# Patient Record
Sex: Female | Born: 1960 | Race: White | Hispanic: No | Marital: Married | Smoking: Never smoker
Health system: Southern US, Community
[De-identification: ages and names within clinical notes are randomized; demographics above are authoritative.]

## PROBLEM LIST (undated history)

## (undated) DIAGNOSIS — K2970 Gastritis, unspecified, without bleeding: Secondary | ICD-10-CM

## (undated) DIAGNOSIS — A048 Other specified bacterial intestinal infections: Secondary | ICD-10-CM

## (undated) HISTORY — PX: KNEE SURGERY: SHX244

---

## 1997-07-03 HISTORY — PX: TUBAL LIGATION: SHX77

## 1997-09-05 ENCOUNTER — Inpatient Hospital Stay (HOSPITAL_COMMUNITY): Admission: AD | Admit: 1997-09-05 | Discharge: 1997-09-05 | Payer: Self-pay | Admitting: *Deleted

## 1997-09-08 ENCOUNTER — Inpatient Hospital Stay (HOSPITAL_COMMUNITY): Admission: AD | Admit: 1997-09-08 | Discharge: 1997-09-08 | Payer: Self-pay | Admitting: Obstetrics and Gynecology

## 2002-07-03 HISTORY — PX: OTHER SURGICAL HISTORY: SHX169

## 2005-01-20 ENCOUNTER — Ambulatory Visit: Payer: Self-pay | Admitting: Family Medicine

## 2005-06-15 ENCOUNTER — Ambulatory Visit: Payer: Self-pay | Admitting: Family Medicine

## 2005-09-26 ENCOUNTER — Ambulatory Visit: Payer: Self-pay | Admitting: Specialist

## 2005-12-06 ENCOUNTER — Ambulatory Visit: Payer: Self-pay | Admitting: Family Medicine

## 2006-12-24 ENCOUNTER — Ambulatory Visit: Payer: Self-pay | Admitting: Family Medicine

## 2007-04-02 ENCOUNTER — Telehealth (INDEPENDENT_AMBULATORY_CARE_PROVIDER_SITE_OTHER): Payer: Self-pay | Admitting: *Deleted

## 2007-04-16 ENCOUNTER — Ambulatory Visit: Payer: Self-pay | Admitting: Family Medicine

## 2007-04-16 DIAGNOSIS — N949 Unspecified condition associated with female genital organs and menstrual cycle: Secondary | ICD-10-CM

## 2007-04-16 DIAGNOSIS — N951 Menopausal and female climacteric states: Secondary | ICD-10-CM

## 2007-04-16 LAB — CONVERTED CEMR LAB
Beta hcg, urine, semiquantitative: NEGATIVE
FSH: 5.9 milliintl units/mL

## 2007-07-19 ENCOUNTER — Telehealth (INDEPENDENT_AMBULATORY_CARE_PROVIDER_SITE_OTHER): Payer: Self-pay | Admitting: *Deleted

## 2007-07-22 ENCOUNTER — Ambulatory Visit: Payer: Self-pay | Admitting: Internal Medicine

## 2007-07-22 DIAGNOSIS — N390 Urinary tract infection, site not specified: Secondary | ICD-10-CM

## 2007-07-22 LAB — CONVERTED CEMR LAB
Bilirubin Urine: NEGATIVE
Nitrite: NEGATIVE
Protein, U semiquant: NEGATIVE
Specific Gravity, Urine: 1.015

## 2007-07-23 ENCOUNTER — Encounter: Payer: Self-pay | Admitting: Internal Medicine

## 2007-07-24 ENCOUNTER — Telehealth: Payer: Self-pay | Admitting: Family Medicine

## 2007-07-24 DIAGNOSIS — J309 Allergic rhinitis, unspecified: Secondary | ICD-10-CM | POA: Insufficient documentation

## 2007-07-24 DIAGNOSIS — K219 Gastro-esophageal reflux disease without esophagitis: Secondary | ICD-10-CM

## 2007-08-23 ENCOUNTER — Ambulatory Visit: Payer: Self-pay | Admitting: Family Medicine

## 2007-08-29 ENCOUNTER — Telehealth: Payer: Self-pay | Admitting: Family Medicine

## 2007-09-09 ENCOUNTER — Telehealth: Payer: Self-pay | Admitting: Family Medicine

## 2008-01-10 ENCOUNTER — Ambulatory Visit: Payer: Self-pay | Admitting: Family Medicine

## 2008-01-10 DIAGNOSIS — R51 Headache: Secondary | ICD-10-CM

## 2008-01-10 DIAGNOSIS — R519 Headache, unspecified: Secondary | ICD-10-CM | POA: Insufficient documentation

## 2008-03-13 ENCOUNTER — Ambulatory Visit: Payer: Self-pay | Admitting: Family Medicine

## 2008-04-20 ENCOUNTER — Telehealth (INDEPENDENT_AMBULATORY_CARE_PROVIDER_SITE_OTHER): Payer: Self-pay | Admitting: *Deleted

## 2008-07-07 ENCOUNTER — Ambulatory Visit: Payer: Self-pay | Admitting: Family Medicine

## 2008-07-07 DIAGNOSIS — L723 Sebaceous cyst: Secondary | ICD-10-CM

## 2008-07-07 DIAGNOSIS — J069 Acute upper respiratory infection, unspecified: Secondary | ICD-10-CM | POA: Insufficient documentation

## 2008-07-15 ENCOUNTER — Ambulatory Visit: Payer: Self-pay | Admitting: Family Medicine

## 2008-12-03 ENCOUNTER — Telehealth: Payer: Self-pay | Admitting: Family Medicine

## 2009-03-09 ENCOUNTER — Telehealth (INDEPENDENT_AMBULATORY_CARE_PROVIDER_SITE_OTHER): Payer: Self-pay | Admitting: *Deleted

## 2009-03-10 ENCOUNTER — Ambulatory Visit: Payer: Self-pay | Admitting: Family Medicine

## 2009-03-10 DIAGNOSIS — M25519 Pain in unspecified shoulder: Secondary | ICD-10-CM

## 2009-03-10 DIAGNOSIS — M109 Gout, unspecified: Secondary | ICD-10-CM

## 2009-03-10 DIAGNOSIS — M545 Low back pain, unspecified: Secondary | ICD-10-CM | POA: Insufficient documentation

## 2009-03-10 DIAGNOSIS — M25559 Pain in unspecified hip: Secondary | ICD-10-CM

## 2009-04-19 ENCOUNTER — Telehealth: Payer: Self-pay | Admitting: Family Medicine

## 2009-07-29 ENCOUNTER — Ambulatory Visit: Payer: Self-pay | Admitting: Family Medicine

## 2009-07-29 ENCOUNTER — Telehealth: Payer: Self-pay | Admitting: Family Medicine

## 2009-07-29 DIAGNOSIS — M25569 Pain in unspecified knee: Secondary | ICD-10-CM

## 2009-07-29 DIAGNOSIS — M1710 Unilateral primary osteoarthritis, unspecified knee: Secondary | ICD-10-CM

## 2009-07-29 DIAGNOSIS — IMO0002 Reserved for concepts with insufficient information to code with codable children: Secondary | ICD-10-CM | POA: Insufficient documentation

## 2009-08-20 ENCOUNTER — Ambulatory Visit: Payer: Self-pay | Admitting: Licensed Clinical Social Worker

## 2009-08-27 ENCOUNTER — Ambulatory Visit: Payer: Self-pay | Admitting: Licensed Clinical Social Worker

## 2009-09-06 ENCOUNTER — Telehealth (INDEPENDENT_AMBULATORY_CARE_PROVIDER_SITE_OTHER): Payer: Self-pay | Admitting: *Deleted

## 2009-09-06 ENCOUNTER — Encounter: Payer: Self-pay | Admitting: Family Medicine

## 2009-09-10 ENCOUNTER — Ambulatory Visit: Payer: Self-pay | Admitting: Licensed Clinical Social Worker

## 2009-10-25 ENCOUNTER — Telehealth (INDEPENDENT_AMBULATORY_CARE_PROVIDER_SITE_OTHER): Payer: Self-pay | Admitting: *Deleted

## 2010-08-02 NOTE — Progress Notes (Signed)
Summary: Pt Questions re: Discharge  Phone Note Call from Patient   Caller: Patient Action Taken: Phone Call Completed Details for Reason: Questions re: discharge from practice Summary of Call: Pt stated that she received the discharge letter.  Pt called and had questions about discharge from the practice. The documentation from the discharge letter and verbage from the letter was discussed with her.  It was explained that she could not establish with another physician and that discharge letters from [facility] physicians were intended to be discharge notification from the practice; therefore, she was unable to be reassigned to another [facility] physician.  The telephone number and information for seeking another physician in the letter was discussed.  It was explained that no information for another patient could be discussed due to privacy/HIPPA.  She verbalized that she understood the letter and discharge recommendations made in the discharge letter.   Initial call taken by: Clarisa Schools,  October 25, 2009 9:10 AM

## 2010-08-02 NOTE — Letter (Signed)
Summary: Discharge Letter  [place] [facility]  [address] [place], [place] [address]   Phone: [phone]  Fax: [phone]       09/06/2009 MRN: [id number]  [facility] [address] [place], [place]  [address]  Dear Ms. Picinich,   I find it necessary to inform you that the office will no longer be able to provide medical care to you due to the nature of a recent call in which your behavior was reported as demanding and it was indicated that there was a communication of threat during a telephone coversation.    Since your condition requires medical attention, it is suggested that you place your self under the care of another physician without delay. If you desire, the doctor will be available for emergency care for 30 days after you receive this letter.  This should give you ample time to select a physician of your choice from the many competent providers in this area. You may want to call the local medical society or [facility]'s physician referral service [phone]) for their assistance in locating a new physician. With your written authorization, I will make a copy of your medical record available to your new physician.   Sincerely,  [facility]  Appended Document: Discharge Letter  IDX and EMR have been updated to reflect dismissal. Letter sent by certified mail 10/15/09.  Appended Document: Discharge Letter Received signed receipt from [employer]PS verifying delivery on 10/20/09.

## 2010-08-02 NOTE — Assessment & Plan Note (Signed)
Summary: 30 MIN HIP AND KNEE/ PAIN/RBH   Vital Signs:  Patient profile:   50 year old female Weight:      128.38 pounds BMI:     23.57 Temp:     98.1 degrees F oral Pulse rate:   72 / minute Pulse rhythm:   regular BP sitting:   100 / 60  (left arm) Cuff size:   regular  Vitals Entered By: Linde Gillis CMA Duncan Dull) (July 29, 2009 9:58 AM) CC: left hip and knee pain   History of Present Illness: 50 year old female  For about six month, has not been able to do hardly anthing for about six months, now with some posterior fullness and medial knee pain.  Distantly, Had a bucket handle meniscal tear. Has had 3 meniscus injuries.  Had some loose cartilage. 2 others done at [facility].  Arthroscopy x 3, all medial  Last one done at [facility].   Works out at 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 Swollen initially  No mechanical locking up of the joint. No symptomatic giving way. Intolerant to most NSAIDS, can take Advil  Allergies: 1)  ! Keflex 2)  ! * Marcaine 3)  * Nsaids Group 4)  * Sulfa (Sulfonamides) Group 5)  * Vantin (Cefpodoxime)  Past History:  Past medical, surgical, family and social histories (including risk factors) reviewed, and no changes noted (except as noted below).  Past Medical History: Reviewed history from 07/22/2007 and no changes required. Allergic rhinitis GERD Frequent UTI's  Past Surgical History: Reviewed history from 07/22/2007 and no changes required. Right knee surgery- meniscus repair Ureter surgery Cardio work-up-  2000 (per patient)  Family History: Reviewed history from 07/22/2007 and no changes required. Father deceased of cancer, leukemia and brain tumor Mother is alive and healthy Paternal grandfather- colon cancer Two paternal cousins with colon cancer One paternal cousin with breast cancer  Social History: Reviewed history from 07/07/2008 and  no changes required. 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 Otherwise the pertinent positives of the ROS are noted above.    Physical Exam  General:  GEN: Well-developed,well-nourished,in no acute distress; alert,appropriate and cooperative throughout examination HEENT: Normocephalic and atraumatic without obvious abnormalities. No apparent alopecia or balding. Ears, externally no deformities PULM: Breathing comfortably in no respiratory distress EXT: No clubbing, cyanosis, or edema PSYCH: Normally interactive. Cooperative during the interview. Pleasant. Friendly and conversant. Not anxious or depressed appearing. Normal, full affect.  Msk:  Gait: Normal heel toe pattern ROM: loss of flexion, to 125 Effusion: minimal, posterior fullness present Echymosis or edema: none Patellar tendon NT Painful PLICA: neg Patellar grind: positive Medial and lateral patellar facet loading: pos medial and lateral joint lines:TTP medially Mcmurray's pos for pain Flexion-pinch pos Varus and valgus stress: stable Lachman: neg Ant and Post drawer: neg Hip abduction, IR, ER: WNL Hip flexion str: 5/5 Hip abd: 5/5 Quad: 5/5 VMO atrophy:yes Hamstring concentric and eccentric: 5/5, NT, no defect   Impression & Recommendations:  Problem # 1:  KNEE PAIN, RIGHT (ICD-719.46) Assessment New  X-rays: AP Bilateral Weight-bearing, Weightbearing Lateral, Sunrise views Indication: knee  pain Findings:  R medial compartment with significant loss of joint space and flattening for medial femoral condyle. Moderate PF OA. Overall, moderate OA of R knee with approaching severe medial compartment OA. No fx.  Severe OA  flare vs. degenerative meniscal tear  Trial of conservative management with knee injection, ice, routine NSAIDS, if no prompt recovery, then MRI R knee to evaluate for internal derangement. I suspect a high likelihood of internal derangement, meniscal tear, positive mcmurrays and flexion pinch tests.  Knee Injection Patient verbally consented to procedure. Risks, benefits, and alternatives explained. Sterilely prepped with betadine. Ethyl cholride used for anesthesia. 9 cc 0.5% Marcaine mixed with 1 cc of Kenalog 40 mg injected using the anterolateral approach without difficulty. No complications with procedure and tolerated well. Patient had decreased pain post-injection.   Her updated medication list for this problem includes:    Naprosyn 500 Mg Tabs (Naproxen) .Marland Kitchen... 1 by mouth two times a day with food as needed pain  Orders: Radiology other (Radiology Other) Joint Aspirate / Injection, Large (20610) Kenalog 10mg  (4units) (J3301)  Problem # 2:  DEGENERATIVE JOINT DISEASE, RIGHT KNEE (ICD-715.96) Assessment: New  Her updated medication list for this problem includes:    Naprosyn 500 Mg Tabs (Naproxen) .Marland Kitchen... 1 by mouth two times a day with food as needed pain  Orders: Joint Aspirate / Injection, Large (20610) Kenalog 10mg  (4units) (J3301)  Complete Medication List: 1)  Naprosyn 500 Mg Tabs (Naproxen) .Marland Kitchen.. 1 by mouth two times a day with food as needed pain 2)  Amoxicillin 500 Mg Caps (Amoxicillin) .Marland Kitchen.. 1 by mouth three times a day for 7 days  Patient Instructions: 1)  Tylenol: 2 tablets up to 3-4 times a day 2)  Topical Capzaicin Cream, as needed (wear glove to put on) 3)  For flares, corticosteroid injections help. 4)  Hyaluronic Acid injections have good success, average relief is 6 months 5)  Glucosamine and Chondroitin often helpful 6)  Omega-3 fish oils may help 7)  Ice joints on bad days, 20 min, 2-3 x / day 8)  REGULAR EXERCISE: strengthening and aerobic  exercise  Current Allergies (reviewed today): ! KEFLEX ! * MARCAINE * NSAIDS GROUP * SULFA (SULFONAMIDES) GROUP * VANTIN (CEFPODOXIME)

## 2010-08-02 NOTE — Progress Notes (Signed)
Summary: Feeling weird after steroid injection  Phone Note Call from Patient Call back at [phone]   Caller: Patient Call For: Kerby Nora MD Summary of Call: Patient was seen this morning by Dr. Patsy Lager and received a steroid shot in her right knee.  Now experiencing some dizziness, shakiness, sweating, skin feels clammy to the touch, patient says she just feels tingly all over.  Never had a steroid injection before.  Able to eat and drink without being sick to her stomach.  Wants to know if this is normal?  Please advise. Initial call taken by: Linde Gillis CMA Duncan Dull),  July 29, 2009 3:17 PM  Follow-up for Phone Call        Spoke with patient she  says that every since she got the shot she has been dizzy,nausea,clammy,she says she went home ate and took 4 advil patient says that she is not used to taken medication a lot. Says she just feels tingley from head to toe. She said that she just wanted to let you know how she was doing. She will await your call this evening. Follow-up by: Benny Lennert CMA Duncan Dull),  July 29, 2009 3:40 PM  Additional Follow-up for Phone Call Additional follow up Details #1::        d/w patient  onset after inj.  sounds like reaction to marcaine would be highly unlikely to be from kenalog  feels ok now Additional Follow-up by: Hannah Beat MD,  July 29, 2009 5:41 PM

## 2010-08-02 NOTE — Progress Notes (Signed)
Summary: Discharge Recommendation Made to Manager  Phone Note Other Incoming   Details for Reason: Discharge Info Summary of Call: Management ONLY to discuss with patient.Marland KitchenMarland KitchenDo not give staff or manager's names or information to patient.  Do not schedule patient and do not forward calls to patient's nurse or Dr. Milinda Antis.  The patient is a [employer] features editor - Do not discuss or state [employer] [place] info to patient.  All communication must go through Managers.       See discharge letter with details on discharge and on the health system/[facility] policies and protocol for addressing patient behavior.  [facility] is committed to providing quality care and expects patients to be compliant and respectful to physicians and staff and upon review of information provided, the patient has been recommended for discharge.   Initial call taken by: Clarisa Schools,  September 06, 2009 4:44 PM     Appended Document: Discharge Recommendation Made to Manager (CONFIDENTIAL DOCUMENTATION)  Phone Note/Statement reported - pt stated "if she had her hands on you (physician), she would choke you".  Also reported, patient has hx of escalated behavior and steroid use that could escalate behavior.

## 2010-11-15 NOTE — Assessment & Plan Note (Signed)
[facility]                                 ON-CALL NOTE   NAME:CUSTERWinston, Sobczyk                        MRN:          [id number]  DATE:08/17/2007                            DOB:          04-18-1961    Patient of Dr. Milinda Antis.   Patient calling because she has 2-day history of runny nose, head  congestion, and cough.  She has no fever, chills, nausea, vomiting,  diarrhea, nor sputum production.  She is a nonsmoker.  She said she has  not been sick in 15 years.  She is otherwise well.  Advised this is most  likely a viral syndrome.  Treat symptomatically.  Call if any fever,  chills, sputum production, etc.     Tinnie Gens A. Tawanna Cooler, MD  Electronically Signed    JAT/MedQ  DD: 08/17/2007  DT: 08/19/2007  Job #: [id number]

## 2013-12-23 ENCOUNTER — Emergency Department: Payer: Self-pay | Admitting: Emergency Medicine

## 2013-12-23 LAB — COMPREHENSIVE METABOLIC PANEL
AST: 42 U/L — AB (ref 15–37)
Albumin: 4.1 g/dL (ref 3.4–5.0)
Alkaline Phosphatase: 66 U/L
Anion Gap: 8 (ref 7–16)
BUN: 24 mg/dL — ABNORMAL HIGH (ref 7–18)
Bilirubin,Total: 0.4 mg/dL (ref 0.2–1.0)
CREATININE: 0.76 mg/dL (ref 0.60–1.30)
Calcium, Total: 9.4 mg/dL (ref 8.5–10.1)
Chloride: 109 mmol/L — ABNORMAL HIGH (ref 98–107)
Co2: 26 mmol/L (ref 21–32)
EGFR (African American): >60
EGFR (Non-African Amer.): >60
GLUCOSE: 96 mg/dL (ref 65–99)
Osmolality: 289 (ref 275–301)
POTASSIUM: 3.8 mmol/L (ref 3.5–5.1)
SGPT (ALT): 36 U/L (ref 12–78)
Sodium: 143 mmol/L (ref 136–145)
Total Protein: 7.4 g/dL (ref 6.4–8.2)

## 2013-12-23 LAB — CBC WITH DIFFERENTIAL/PLATELET
BASOS ABS: 0.1 10*3/uL (ref 0.0–0.1)
BASOS PCT: 0.6 %
Eosinophil #: 0 10*3/uL (ref 0.0–0.7)
Eosinophil %: 0.3 %
HCT: 41.4 % (ref 35.0–47.0)
HGB: 13.2 g/dL (ref 12.0–16.0)
LYMPHS ABS: 2.6 10*3/uL (ref 1.0–3.6)
Lymphocyte %: 22.8 %
MCH: 28.8 pg (ref 26.0–34.0)
MCHC: 31.9 g/dL — ABNORMAL LOW (ref 32.0–36.0)
MCV: 90 fL (ref 80–100)
MONOS PCT: 7.2 %
Monocyte #: 0.8 x10 3/mm (ref 0.2–0.9)
NEUTROS PCT: 69.1 %
Neutrophil #: 8 10*3/uL — ABNORMAL HIGH (ref 1.4–6.5)
PLATELETS: 242 10*3/uL (ref 150–440)
RBC: 4.59 10*6/uL (ref 3.80–5.20)
RDW: 13.4 % (ref 11.5–14.5)
WBC: 11.5 10*3/uL — ABNORMAL HIGH (ref 3.6–11.0)

## 2013-12-23 LAB — APTT: Activated PTT: 26.6 secs (ref 23.6–35.9)

## 2013-12-23 LAB — CBC
HCT: 40.2 % (ref 35.0–47.0)
HGB: 13.5 g/dL (ref 12.0–16.0)
MCH: 30.1 pg (ref 26.0–34.0)
MCHC: 33.6 g/dL (ref 32.0–36.0)
MCV: 89 fL (ref 80–100)
PLATELETS: 250 10*3/uL (ref 150–440)
RBC: 4.5 10*6/uL (ref 3.80–5.20)
RDW: 13.5 % (ref 11.5–14.5)
WBC: 9.6 10*3/uL (ref 3.6–11.0)

## 2013-12-23 LAB — PROTIME-INR
INR: 1
INR: 1
PROTHROMBIN TIME: 13.1 s (ref 11.5–14.7)
Prothrombin Time: 12.7 s

## 2013-12-23 LAB — FIBRINOGEN
FIBRINOGEN: 281 mg/dL (ref 210–470)
Fibrinogen: 253 mg/dL (ref 210–470)

## 2013-12-25 ENCOUNTER — Emergency Department: Payer: Self-pay | Admitting: Emergency Medicine

## 2014-08-05 DIAGNOSIS — M1711 Unilateral primary osteoarthritis, right knee: Secondary | ICD-10-CM | POA: Insufficient documentation

## 2016-07-02 ENCOUNTER — Encounter: Payer: Self-pay | Admitting: Emergency Medicine

## 2016-07-02 ENCOUNTER — Emergency Department
Admission: EM | Admit: 2016-07-02 | Discharge: 2016-07-02 | Disposition: A | Discharge status: Home / Self Care | Payer: Managed Care, Other (non HMO) | Attending: Emergency Medicine | Admitting: Emergency Medicine

## 2016-07-02 ENCOUNTER — Emergency Department: Payer: Managed Care, Other (non HMO)

## 2016-07-02 DIAGNOSIS — R1030 Lower abdominal pain, unspecified: Secondary | ICD-10-CM | POA: Diagnosis present

## 2016-07-02 DIAGNOSIS — A09 Infectious gastroenteritis and colitis, unspecified: Secondary | ICD-10-CM | POA: Insufficient documentation

## 2016-07-02 DIAGNOSIS — Z79899 Other long term (current) drug therapy: Secondary | ICD-10-CM | POA: Diagnosis not present

## 2016-07-02 HISTORY — DX: Other specified bacterial intestinal infections: A04.8

## 2016-07-02 LAB — COMPREHENSIVE METABOLIC PANEL
ALBUMIN: 4.2 g/dL (ref 3.5–5.0)
ALT: 49 U/L (ref 14–54)
ANION GAP: 7 (ref 5–15)
AST: 41 U/L (ref 15–41)
Alkaline Phosphatase: 64 U/L (ref 38–126)
BUN: 14 mg/dL (ref 6–20)
CALCIUM: 9.2 mg/dL (ref 8.9–10.3)
CHLORIDE: 106 mmol/L (ref 101–111)
CO2: 26 mmol/L (ref 22–32)
CREATININE: 0.9 mg/dL (ref 0.44–1.00)
GFR calc Af Amer: >60 mL/min (ref >60)
GFR calc non Af Amer: >60 mL/min (ref >60)
GLUCOSE: 112 mg/dL — AB (ref 65–99)
Potassium: 3.9 mmol/L (ref 3.5–5.1)
SODIUM: 139 mmol/L (ref 135–145)
Total Bilirubin: 0.5 mg/dL (ref 0.3–1.2)
Total Protein: 7.2 g/dL (ref 6.5–8.1)

## 2016-07-02 LAB — CBC
HCT: 44.8 % (ref 35.0–47.0)
HEMOGLOBIN: 15.2 g/dL (ref 12.0–16.0)
MCH: 29 pg (ref 26.0–34.0)
MCHC: 33.8 g/dL (ref 32.0–36.0)
MCV: 85.7 fL (ref 80.0–100.0)
Platelets: 321 10*3/uL (ref 150–440)
RBC: 5.23 MIL/uL — ABNORMAL HIGH (ref 3.80–5.20)
RDW: 13.4 % (ref 11.5–14.5)
WBC: 13.7 10*3/uL — ABNORMAL HIGH (ref 3.6–11.0)

## 2016-07-02 LAB — URINALYSIS, COMPLETE (UACMP) WITH MICROSCOPIC
BILIRUBIN URINE: NEGATIVE
Bacteria, UA: NONE SEEN
Glucose, UA: NEGATIVE mg/dL
Hgb urine dipstick: NEGATIVE
KETONES UR: 20 mg/dL — AB
Nitrite: NEGATIVE
PH: 5 (ref 5.0–8.0)
PROTEIN: 30 mg/dL — AB
RBC / HPF: NONE SEEN RBC/hpf (ref 0–5)
Specific Gravity, Urine: 1.027 (ref 1.005–1.030)

## 2016-07-02 LAB — LIPASE, BLOOD: LIPASE: 18 U/L (ref 11–51)

## 2016-07-02 MED ORDER — MORPHINE SULFATE (PF) 4 MG/ML IV SOLN
4.0000 mg | Freq: Once | INTRAVENOUS | Status: AC
  Administered 2016-07-02: 4 mg via INTRAVENOUS
  Filled 2016-07-02: qty 1

## 2016-07-02 MED ORDER — ONDANSETRON 4 MG PO TBDP
4.0000 mg | ORAL_TABLET | Freq: Once | ORAL | Status: AC
  Administered 2016-07-02: 4 mg via ORAL
  Filled 2016-07-02: qty 1

## 2016-07-02 MED ORDER — CIPROFLOXACIN HCL 500 MG PO TABS: 500.0000 mg | ORAL_TABLET | Freq: Two times a day (BID) | ORAL | 0 refills | Status: DC

## 2016-07-02 MED ORDER — SODIUM CHLORIDE 0.9 % IV BOLUS (SEPSIS)
1000.0000 mL | Freq: Once | INTRAVENOUS | Status: AC
  Administered 2016-07-02: 1000 mL via INTRAVENOUS

## 2016-07-02 MED ORDER — IOPAMIDOL (ISOVUE-300) INJECTION 61%
15.0000 mL | INTRAVENOUS | Status: AC
  Administered 2016-07-02 (×2): 15 mL via ORAL

## 2016-07-02 MED ORDER — HYDROCODONE-ACETAMINOPHEN 5-325 MG PO TABS: 1.0000 | ORAL_TABLET | Freq: Four times a day (QID) | ORAL | 0 refills | Status: DC | PRN

## 2016-07-02 MED ORDER — METRONIDAZOLE 500 MG PO TABS
500.0000 mg | ORAL_TABLET | Freq: Once | ORAL | Status: AC
  Administered 2016-07-02: 500 mg via ORAL
  Filled 2016-07-02: qty 1

## 2016-07-02 MED ORDER — ONDANSETRON 4 MG PO TBDP: 4.0000 mg | ORAL_TABLET | Freq: Four times a day (QID) | ORAL | 0 refills | Status: DC | PRN

## 2016-07-02 MED ORDER — HYDROCODONE-ACETAMINOPHEN 5-325 MG PO TABS
ORAL_TABLET | ORAL | Status: AC
  Administered 2016-07-02: 1 via ORAL
  Filled 2016-07-02: qty 1

## 2016-07-02 MED ORDER — CIPROFLOXACIN HCL 500 MG PO TABS
500.0000 mg | ORAL_TABLET | Freq: Once | ORAL | Status: AC
  Administered 2016-07-02: 500 mg via ORAL
  Filled 2016-07-02: qty 1

## 2016-07-02 MED ORDER — ONDANSETRON HCL 4 MG/2ML IJ SOLN
4.0000 mg | Freq: Once | INTRAMUSCULAR | Status: AC
  Administered 2016-07-02: 4 mg via INTRAVENOUS
  Filled 2016-07-02: qty 2

## 2016-07-02 MED ORDER — HYDROCODONE-ACETAMINOPHEN 5-325 MG PO TABS
1.0000 | ORAL_TABLET | Freq: Once | ORAL | Status: AC
  Administered 2016-07-02: 1 via ORAL

## 2016-07-02 MED ORDER — METRONIDAZOLE 500 MG PO TABS: 500.0000 mg | ORAL_TABLET | Freq: Three times a day (TID) | ORAL | 0 refills | Status: DC

## 2016-07-02 NOTE — ED Provider Notes (Signed)
[facility] Emergency Department Provider Note   ____________________________________________   First MD Initiated Contact with Patient 07/02/16 1919     (approximate)  I have reviewed the triage vital signs and the nursing notes.   HISTORY  Chief Complaint Abdominal Pain    HPI Taylor Navarro is a 55 y.o. female here for evaluation of abdominal pain and diarrhea and nausea and vomiting.  The patient returned from [place], while she was there she felt like she had some loose stool and diarrhea and a couple of days which improved.  Yesterday she began expressing loose stools and having crampy lower abdominal pain, she vomited a couple times today after feeling nauseated. She's been able to continue drinking some water, but feels slightly fatigued.  No fever. No chest pain or shortness of breath.   Denies any bad food exposure, but does have trouble history none  Past Medical History:  Diagnosis Date  . Bacterial infection due to H. pylori     Patient Active Problem List   Diagnosis Date Noted  . DEGENERATIVE JOINT DISEASE, RIGHT KNEE 07/29/2009  . KNEE PAIN, RIGHT 07/29/2009  . GOUT 03/10/2009  . SHOULDER PAIN 03/10/2009  . HIP PAIN, RIGHT 03/10/2009  . BACK PAIN, LUMBAR 03/10/2009  . URI 07/07/2008  . SEBACEOUS CYST, NECK 07/07/2008  . HEADACHE 01/10/2008  . ALLERGIC RHINITIS 07/24/2007  . GERD 07/24/2007  . URINARY TRACT INFECTION, RECURRENT 07/22/2007  . 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 HOT FLASHES 04/16/2007    Past Surgical History:  Procedure Laterality Date  . KNEE SURGERY      Prior to Admission medications   Medication Sig Start Date End Date Taking? Authorizing Provider  Nutritional Supplements (JOINT FORMULA PO) Take 1 tablet by mouth. From [employer]   Yes Historical Provider, MD  ciprofloxacin (CIPRO) 500 MG tablet Take 1 tablet (500 mg total) by mouth 2 (two) times daily. 07/02/16   Sharyn Creamer, MD    HYDROcodone-acetaminophen (NORCO/VICODIN) 5-325 MG tablet Take 1 tablet by mouth every 6 (six) hours as needed for moderate pain. 07/02/16   Sharyn Creamer, MD  metroNIDAZOLE (FLAGYL) 500 MG tablet Take 1 tablet (500 mg total) by mouth 3 (three) times daily. 07/02/16   Sharyn Creamer, MD  ondansetron (ZOFRAN ODT) 4 MG disintegrating tablet Take 1 tablet (4 mg total) by mouth every 6 (six) hours as needed for nausea or vomiting. 07/02/16   Sharyn Creamer, MD    Allergies Bupivacaine; Cephalexin; and Sulfonamide derivatives  History reviewed. No pertinent family history.  Social History Social History  Substance Use Topics  . Smoking status: Never Smoker  . Smokeless tobacco: Never Used  . Alcohol use Yes    Review of Systems Constitutional: No fever/chills Eyes: No visual changes. ENT: No sore throat. Cardiovascular: Denies chest pain. Respiratory: Denies shortness of breath. Gastrointestinal: No black or bloody stool. No blood in her vomit  No constipation. Genitourinary: Negative for dysuria. Musculoskeletal: Negative for back pain. Skin: Negative for rash. Neurological: Negative for headaches, focal weakness or numbness.  10-point ROS otherwise negative.  ____________________________________________   PHYSICAL EXAM:  VITAL SIGNS: ED Triage Vitals  Enc Vitals Group     BP 07/02/16 1701 138/65     Pulse Rate 07/02/16 1701 88     Resp 07/02/16 1701 18     Temp 07/02/16 1701 97.5 F (36.4 C)     Temp Source 07/02/16 1701 Oral     SpO2 07/02/16 1701 99 %  Weight 07/02/16 1701 139 lb (63 kg)     Height 07/02/16 1701 5' (1.524 m)     Head Circumference --      Peak Flow --      Pain Score 07/02/16 1703 7     Pain Loc --      Pain Edu? --      Excl. in GC? --     Constitutional: Alert and oriented. Well appearing and in no acute distress. Eyes: Conjunctivae are normal. PERRL. EOMI. Head: Atraumatic. Nose: No congestion/rhinnorhea. Mouth/Throat: Mucous membranes are  moist.  Oropharynx non-erythematous. Neck: No stridor.   Cardiovascular: Normal rate, regular rhythm. Grossly normal heart sounds.  Good peripheral circulation. Respiratory: Normal respiratory effort.  No retractions. Lungs CTAB. Gastrointestinal: Soft and mild to moderately tender primarily in the lower abdomen in the right mid to right lower quadrant, without rebound or guarding. . No distention. No abdominal bruits. No CVA tenderness. Musculoskeletal: No lower extremity tenderness nor edema.  Neurologic:  Normal speech and language. No gross focal neurologic deficits are appreciated. No gait instability. Skin:  Skin is warm, dry and intact. No rash noted. Psychiatric: Mood and affect are normal. Speech and behavior are normal.  ____________________________________________   LABS (all labs ordered are listed, but only abnormal results are displayed)  Labs Reviewed  COMPREHENSIVE METABOLIC PANEL - Abnormal; Notable for the following:       Result Value   Glucose, Bld 112 (*)    All other components within normal limits  CBC - Abnormal; Notable for the following:    WBC 13.7 (*)    RBC 5.23 (*)    All other components within normal limits  URINALYSIS, COMPLETE (UACMP) WITH MICROSCOPIC - Abnormal; Notable for the following:    Color, Urine YELLOW (*)    APPearance HAZY (*)    Ketones, ur 20 (*)    Protein, ur 30 (*)    Leukocytes, UA SMALL (*)    Squamous Epithelial / LPF 6-30 (*)    All other components within normal limits  URINE CULTURE  LIPASE, BLOOD   ____________________________________________  EKG   ____________________________________________  RADIOLOGY  Ct Abdomen Pelvis Wo Contrast  Result Date: 07/02/2016 CLINICAL DATA:  Epigastric and periumbilical pain with diarrhea for 1 day. Recent travel. EXAM: CT ABDOMEN AND PELVIS WITHOUT CONTRAST TECHNIQUE: Multidetector CT imaging of the abdomen and pelvis was performed following the standard protocol without IV  contrast. COMPARISON:  None. FINDINGS: Lower chest: No acute abnormality. Hepatobiliary: No focal liver abnormality is seen. No gallstones, gallbladder wall thickening, or biliary dilatation. Pancreas: Unremarkable. No pancreatic ductal dilatation or surrounding inflammatory changes. Spleen: Normal in size without focal abnormality. Adrenals/Urinary Tract: Adrenal glands are unremarkable. Kidneys are normal, without renal calculi, focal lesion, or hydronephrosis. Bladder is unremarkable. Stomach/Bowel: Stomach and proximal small bowel appear normal. There is marked mural thickening and edema of the mid ileum. Mild adjacent mesenteric edema. Small to moderate volume ascites. Appendix and colon are normal. Vascular/Lymphatic: The abdominal aorta is normal in caliber with mild atherosclerotic calcification. No adenopathy in the abdomen or pelvis. Reproductive: Status post hysterectomy. No adnexal masses. Other: No extraluminal air.  No significant bowel obstruction. Musculoskeletal: No significant skeletal lesion. IMPRESSION: Marked mural thickening of the mid ileum with moderate edema but no significant obstruction. Terminal ileum appears relatively normal. Appendix is normal. No pneumatosis. No extraluminal air. This may represent a severe enteritis Electronically Signed   By: Ellery Plunkaniel R Mitchell M.D.   On: 07/02/2016 21:42  ____________________________________________   PROCEDURES  Procedure(s) performed: None  Procedures  Critical Care performed: No  ____________________________________________   INITIAL IMPRESSION / ASSESSMENT AND PLAN / ED COURSE  Pertinent labs & imaging results that were available during my care of the patient were reviewed by me and considered in my medical decision making (see chart for details).  Differential diagnosis includes but is not limited to, abdominal perforation, aortic dissection, cholecystitis, appendicitis, diverticulitis, colitis, esophagitis/gastritis,  kidney stone, pyelonephritis, urinary tract infection, aortic aneurysm. All are considered in decision and treatment plan. Based upon the patient's presentation and risk factors, patient appears to be suffering from a gastrointestinal illness, possibly travel related or infectious related. Proceed with CT scan given the focality and right lower abdomen, leukocytosis. I offered the patient and we discussed obtaining CT scan with IV contrast, and IV fluids with the patient did not wish for IV fluids or contrast at this time. Patient does not wish take risk of consciousness duration at this time, and wants to drink water and not have an IV as she reports whenever she has received IV fluid in the past is caused discomfort and bruising about her veins. I discussed with her and recommend hydration and IV contrast to improve the images from her CAT scan, also make sure it is not a cause such as a blockage of a vessel, or a cause from a artery potentially causing her pain, but after answering questions and we will pursue a CT with oral contrast only after discussion of risks and benefits was performed.   Clinical Course    ----------------------------------------- 10:36 PM on 07/02/2016 -----------------------------------------  Patient is currently resting comfortably, reports her nausea vomiting and diarrhea improved. Her pain is much better. Discussed and reviewed her CT findings, I also discussed her clinical history and findings of CT and labs with Dr. Bosie Clos and gastroenterology first given her travel history. He recommends placing her on Cipro and Flagyl for a ten-day course with close follow-up with her primary care doctor. The patient is very agreeable with this plan as well, we'll give her a short course of pain medication which she can use as needed and she understands not to drive while taking this medication and only use as prescribed. Patient will have her son come pick her up tonight.  Return  precautions and treatment recommendations and follow-up discussed with the patient who is agreeable with the plan.  Patient sees Dr. Marcelle Overlie, full set up follow-up for a couple of days  ____________________________________________   FINAL CLINICAL IMPRESSION(S) / ED DIAGNOSES  Final diagnoses:  Infectious enteritis, unspecified infectious agent      NEW MEDICATIONS STARTED DURING THIS VISIT:  Discharge Medication List as of 07/02/2016 10:40 PM    START taking these medications   Details  ciprofloxacin (CIPRO) 500 MG tablet Take 1 tablet (500 mg total) by mouth 2 (two) times daily., Starting Sun 07/02/2016, Print    HYDROcodone-acetaminophen (NORCO/VICODIN) 5-325 MG tablet Take 1 tablet by mouth every 6 (six) hours as needed for moderate pain., Starting Sun 07/02/2016, Print    metroNIDAZOLE (FLAGYL) 500 MG tablet Take 1 tablet (500 mg total) by mouth 3 (three) times daily., Starting Sun 07/02/2016, Print    ondansetron (ZOFRAN ODT) 4 MG disintegrating tablet Take 1 tablet (4 mg total) by mouth every 6 (six) hours as needed for nausea or vomiting., Starting Sun 07/02/2016, Print         Note:  This document was prepared using Dragon voice recognition software and  may include unintentional dictation errors.     Sharyn CreamerMark Raudel Bazen, MD 07/03/16 [phone]

## 2016-07-02 NOTE — ED Triage Notes (Addendum)
Pt to ED with c/o of lower abdominal pain and diarrhea that started yesterday. Pt states the pain increased approximately 3 hours ago. Pt still has gall bladder and appendix. [place] Pt also states she started taking a new joint supplement recently.

## 2016-07-02 NOTE — Discharge Instructions (Signed)
? ?  Please return to the emergency room right away if you are to develop a fever, severe nausea, your pain becomes severe or worsens, you are unable to keep food down, begin vomiting any dark or bloody fluid, you develop any dark or bloody stools, feel dehydrated, or other new concerns or symptoms arise. ? ?

## 2016-07-04 LAB — URINE CULTURE: SPECIAL REQUESTS: NORMAL

## 2016-08-23 ENCOUNTER — Other Ambulatory Visit
Admission: RE | Admit: 2016-08-23 | Discharge: 2016-08-23 | Disposition: A | Discharge status: Home / Self Care | Payer: Managed Care, Other (non HMO) | Source: Ambulatory Visit | Attending: Student | Admitting: Student

## 2016-08-23 DIAGNOSIS — R197 Diarrhea, unspecified: Secondary | ICD-10-CM | POA: Diagnosis present

## 2016-08-23 LAB — GASTROINTESTINAL PANEL BY PCR, STOOL (REPLACES STOOL CULTURE)
Adenovirus F40/41: NOT DETECTED
Astrovirus: NOT DETECTED
CAMPYLOBACTER SPECIES: NOT DETECTED
CRYPTOSPORIDIUM: NOT DETECTED
CYCLOSPORA CAYETANENSIS: NOT DETECTED
ENTEROTOXIGENIC E COLI (ETEC): NOT DETECTED
Entamoeba histolytica: NOT DETECTED
Enteroaggregative E coli (EAEC): NOT DETECTED
Enteropathogenic E coli (EPEC): NOT DETECTED
Giardia lamblia: NOT DETECTED
Norovirus GI/GII: NOT DETECTED
PLESIMONAS SHIGELLOIDES: NOT DETECTED
Rotavirus A: NOT DETECTED
SALMONELLA SPECIES: NOT DETECTED
SAPOVIRUS (I, II, IV, AND V): NOT DETECTED
SHIGA LIKE TOXIN PRODUCING E COLI (STEC): NOT DETECTED
SHIGELLA/ENTEROINVASIVE E COLI (EIEC): NOT DETECTED
VIBRIO SPECIES: NOT DETECTED
Vibrio cholerae: NOT DETECTED
YERSINIA ENTEROCOLITICA: NOT DETECTED

## 2016-08-26 LAB — H. PYLORI ANTIGEN, STOOL: H. PYLORI STOOL AG, EIA: NEGATIVE

## 2017-01-26 ENCOUNTER — Ambulatory Visit (INDEPENDENT_AMBULATORY_CARE_PROVIDER_SITE_OTHER): Payer: Managed Care, Other (non HMO) | Admitting: Podiatry

## 2017-01-26 VITALS — BP 134/77 | HR 59 | Temp 97.3°F | Resp 16

## 2017-01-26 DIAGNOSIS — B353 Tinea pedis: Secondary | ICD-10-CM | POA: Diagnosis not present

## 2017-01-26 DIAGNOSIS — B070 Plantar wart: Secondary | ICD-10-CM | POA: Diagnosis not present

## 2017-01-26 MED ORDER — CLOTRIMAZOLE-BETAMETHASONE 1-0.05 % EX CREA: 1.0000 "application " | TOPICAL_CREAM | Freq: Two times a day (BID) | CUTANEOUS | 2 refills | Status: DC

## 2017-01-26 MED ORDER — TERBINAFINE HCL 250 MG PO TABS: 250.0000 mg | ORAL_TABLET | Freq: Every day | ORAL | 0 refills | Status: DC

## 2017-01-26 NOTE — Progress Notes (Signed)
Subjective:     Patient ID: Taylor DeterRhonda J Lomeli, female   DOB: 01/20/1961, 56 y.o.   MRN: [id number]  HPI   Review of Systems  Gastrointestinal: Positive for abdominal distention and abdominal pain.  Musculoskeletal: Positive for arthralgias.  All other systems reviewed and are negative.      Objective:   Physical Exam     Assessment:         Plan:

## 2017-01-26 NOTE — Progress Notes (Signed)
Patient ID: Taylor DeterRhonda J Navarro, female   DOB: 10/04/1960, 56 y.o.   MRN: [id number]   Subjective: Patient presents today with pain and tenderness on the plantar aspect of the left foot secondary to a plantars wart. Patient states that the pain has been present for several weeks now. Patient denies trauma. Patient also states that she experiences some itching to the bilateral feet more significant on the left.   Objective: Physical Exam General: The patient is alert and oriented x3 in no acute distress.  Dermatology: Hyperkeratotic skin lesion noted to the plantar aspect of the left foot approximately 1 cm in diameter. Pinpoint bleeding noted upon debridement. Skin is warm, dry and supple bilateral lower extremities. Negative for open lesions or macerations. There is some mild maceration with hyperkeratotic skin noted to the bilateral feet with pruritus consistent with tinea pedis.  Vascular: Palpable pedal pulses bilaterally. No edema or erythema noted. Capillary refill within normal limits.  Neurological: Epicritic and protective threshold grossly intact bilaterally.   Musculoskeletal Exam: Pain on palpation to the note skin lesion.  Range of motion within normal limits to all pedal and ankle joints bilateral. Muscle strength 5/5 in all groups bilateral.   Assessment: #1 plantar wart left foot #2 pain in left foot #3 tinea pedis bilateral   Plan of Care:  #1 Patient was evaluated. #2 Excisional debridement of the plantar wart lesion was performed using a chisel blade. Cantharone was applied and the lesion was dressed with a dry sterile dressing. #3 prescription for terbinafine 250 mg #28 #4 prescription for Lotrisone cream #5 return to clinic in 2 weeks   Felecia ShellingBrent M. Evans, DPM [facility]  Dr. Felecia ShellingBrent M. Evans, DPM    [address]                                        [place], [place] [address]                Office [phone](336) [phone]  Fax [phone](336)  [phone]

## 2017-01-30 ENCOUNTER — Telehealth: Payer: Self-pay | Admitting: *Deleted

## 2017-01-30 NOTE — Telephone Encounter (Signed)
No additional medication until GI symptoms are resolved. Then we will try Griseofulvin daily #90.  Thanks, Dr. Logan BoresEvans

## 2017-01-30 NOTE — Telephone Encounter (Addendum)
Pt states she was seen 01/26/2017 and started the antibiotic prescribed by Dr. Logan BoresEvans, this weekend began to have indigestion type symptoms and now has diarrhea and severe bloating, has hx of GERD and stomach problem. I told pt to go to PCP for GI symptoms and stop the terbinafine, I would call with further instructions. I informed pt of Dr. Logan BoresEvans' 3:59pm order. Pt states she went to her PCP and PCP states pt should have known better than to start any medication without [facility].

## 2017-02-15 ENCOUNTER — Other Ambulatory Visit: Payer: Self-pay | Admitting: Nurse Practitioner

## 2017-02-15 DIAGNOSIS — Z87898 Personal history of other specified conditions: Secondary | ICD-10-CM

## 2017-02-15 DIAGNOSIS — R109 Unspecified abdominal pain: Secondary | ICD-10-CM

## 2017-02-16 ENCOUNTER — Other Ambulatory Visit
Admission: RE | Admit: 2017-02-16 | Discharge: 2017-02-16 | Disposition: A | Discharge status: Home / Self Care | Payer: Managed Care, Other (non HMO) | Source: Ambulatory Visit | Attending: Nurse Practitioner | Admitting: Nurse Practitioner

## 2017-02-16 DIAGNOSIS — Z87898 Personal history of other specified conditions: Secondary | ICD-10-CM | POA: Diagnosis present

## 2017-02-16 LAB — GASTROINTESTINAL PANEL BY PCR, STOOL (REPLACES STOOL CULTURE)

## 2017-02-16 LAB — C DIFFICILE QUICK SCREEN W PCR REFLEX
C Diff antigen: NEGATIVE
C Diff interpretation: NOT DETECTED
C Diff toxin: NEGATIVE

## 2017-02-28 ENCOUNTER — Ambulatory Visit
Admission: RE | Admit: 2017-02-28 | Discharge: 2017-02-28 | Disposition: A | Discharge status: Home / Self Care | Payer: Managed Care, Other (non HMO) | Source: Ambulatory Visit | Attending: Nurse Practitioner | Admitting: Nurse Practitioner

## 2017-02-28 DIAGNOSIS — Z87898 Personal history of other specified conditions: Secondary | ICD-10-CM | POA: Insufficient documentation

## 2017-02-28 DIAGNOSIS — R748 Abnormal levels of other serum enzymes: Secondary | ICD-10-CM | POA: Diagnosis present

## 2017-02-28 DIAGNOSIS — R109 Unspecified abdominal pain: Secondary | ICD-10-CM | POA: Insufficient documentation

## 2017-02-28 MED ORDER — IOPAMIDOL (ISOVUE-300) INJECTION 61%
100.0000 mL | Freq: Once | INTRAVENOUS | Status: AC | PRN
  Administered 2017-02-28: 100 mL via INTRAVENOUS

## 2017-04-06 ENCOUNTER — Encounter: Payer: Self-pay | Admitting: Podiatry

## 2017-04-06 ENCOUNTER — Ambulatory Visit (INDEPENDENT_AMBULATORY_CARE_PROVIDER_SITE_OTHER): Payer: Managed Care, Other (non HMO) | Admitting: Podiatry

## 2017-04-06 DIAGNOSIS — B070 Plantar wart: Secondary | ICD-10-CM | POA: Diagnosis not present

## 2017-04-06 DIAGNOSIS — L989 Disorder of the skin and subcutaneous tissue, unspecified: Secondary | ICD-10-CM | POA: Diagnosis not present

## 2017-04-09 NOTE — Progress Notes (Signed)
   Subjective: Patient presents to the office today for chief complaint of painful callus lesions of the left forefoot. Patient states that the pain is ongoing and is affecting their ability to ambulate without pain. Patient presents today for further treatment and evaluation.   Past Medical History:  Diagnosis Date  . Bacterial infection due to H. pylori      Objective:  Physical Exam General: Alert and oriented x3 in no acute distress  Dermatology: Hyperkeratotic lesion present on the left plantar forefoot. Pain on palpation with a central nucleated core noted.  Skin is warm, dry and supple bilateral lower extremities. Negative for open lesions or macerations.  Vascular: Palpable pedal pulses bilaterally. No edema or erythema noted. Capillary refill within normal limits.  Neurological: Epicritic and protective threshold grossly intact bilaterally.   Musculoskeletal Exam: Pain on palpation at the keratotic lesion noted. Range of motion within normal limits bilateral. Muscle strength 5/5 in all groups bilateral.  Assessment: #1 Porokeratosis left   Plan of Care:  #1 Patient evaluated #2 Excisional debridement of keratoic lesion using a chisel blade was performed without incident.  #3 Cantharone applied and area dressed with light dressing. #4 Patient is to return to the clinic in 2 weeks.   Felecia Shelling, DPM [facility]  Dr. Felecia Shelling, DPM    [address]                                        [place], [place] [address]                Office [phone]  Fax [phone]

## 2017-04-20 ENCOUNTER — Ambulatory Visit: Payer: Managed Care, Other (non HMO) | Admitting: Podiatry

## 2017-07-10 ENCOUNTER — Ambulatory Visit: Payer: Managed Care, Other (non HMO) | Admitting: Podiatry

## 2017-07-10 ENCOUNTER — Ambulatory Visit (INDEPENDENT_AMBULATORY_CARE_PROVIDER_SITE_OTHER): Payer: Managed Care, Other (non HMO) | Admitting: Podiatry

## 2017-07-10 ENCOUNTER — Encounter: Payer: Self-pay | Admitting: Podiatry

## 2017-07-10 DIAGNOSIS — L989 Disorder of the skin and subcutaneous tissue, unspecified: Secondary | ICD-10-CM

## 2017-07-12 NOTE — Progress Notes (Signed)
   Subjective: Patient presents to the office today for follow up evaluation of a recurrent, painful callus lesion of the left forefoot. Patient states that the pain is ongoing and is affecting their ability to ambulate without pain. Patient presents today for further treatment and evaluation.   Past Medical History:  Diagnosis Date  . Bacterial infection due to H. pylori      Objective:  Physical Exam General: Alert and oriented x3 in no acute distress  Dermatology: Hyperkeratotic lesion present on the left plantar forefoot. Pain on palpation with a central nucleated core noted.  Skin is warm, dry and supple bilateral lower extremities. Negative for open lesions or macerations.  Vascular: Palpable pedal pulses bilaterally. No edema or erythema noted. Capillary refill within normal limits.  Neurological: Epicritic and protective threshold grossly intact bilaterally.   Musculoskeletal Exam: Pain on palpation at the keratotic lesion noted. Range of motion within normal limits bilateral. Muscle strength 5/5 in all groups bilateral.  Assessment: #1 Porokeratosis left foot   Plan of Care:  #1 Patient evaluated #2 Excisional debridement of keratoic lesion using a chisel blade was performed without incident.  #3 Salinocaine applied and area dressed with light dressing. #4 Recommended OTC salicylic acid corn and callus remover. #5 Recommended good shoe gear. #6 Patient is to return to the clinic as needed.   Felecia ShellingBrent M. Kemi Gell, DPM [facility]  Dr. Felecia ShellingBrent M. Codie Hainer, DPM    [address]                                        [place], [place] [address]                Office [phone](336) [phone]  Fax [phone]

## 2017-08-03 ENCOUNTER — Encounter: Payer: Self-pay | Admitting: Podiatry

## 2017-08-03 ENCOUNTER — Other Ambulatory Visit: Payer: Self-pay | Admitting: Podiatry

## 2017-08-03 ENCOUNTER — Ambulatory Visit (INDEPENDENT_AMBULATORY_CARE_PROVIDER_SITE_OTHER): Payer: Managed Care, Other (non HMO)

## 2017-08-03 ENCOUNTER — Ambulatory Visit (INDEPENDENT_AMBULATORY_CARE_PROVIDER_SITE_OTHER): Payer: Managed Care, Other (non HMO) | Admitting: Podiatry

## 2017-08-03 DIAGNOSIS — B070 Plantar wart: Secondary | ICD-10-CM | POA: Diagnosis not present

## 2017-08-03 DIAGNOSIS — M7671 Peroneal tendinitis, right leg: Secondary | ICD-10-CM

## 2017-08-03 DIAGNOSIS — M79671 Pain in right foot: Secondary | ICD-10-CM

## 2017-08-06 NOTE — Progress Notes (Signed)
   Subjective: 57 year old female presenting today for follow up evaluation of recurring pain to the plantar aspect of the left foot. She has been using OTC corn and callus remover with no significant relief. She also reports constant aching and burning pain to the lateral side of the right foot that began 1-2 months ago. She has not done anything to treat these symptoms. Walking and bearing weight on the feet increase the pain in both feet. There are no alleviating factors noted. Patient denies trauma. Patient is here for further evaluation and treatment.   Past Medical History:  Diagnosis Date  . Bacterial infection due to H. pylori     Objective: Physical Exam General: The patient is alert and oriented x3 in no acute distress.  Dermatology: Hyperkeratotic skin lesion noted to the plantar aspect of the left foot approximately 1 cm in diameter. Pinpoint bleeding noted upon debridement. Skin is warm, dry and supple bilateral lower extremities. Negative for open lesions or macerations.  Vascular: Palpable pedal pulses bilaterally. No edema or erythema noted. Capillary refill within normal limits.  Neurological: Epicritic and protective threshold grossly intact bilaterally.   Musculoskeletal Exam: Pain on palpation to the note skin lesion. Pain with palpation to the lateral right foot over the insertion of the peroneal tendons. Range of motion within normal limits to all pedal and ankle joints bilateral. Muscle strength 5/5 in all groups bilateral.   Radiographic Exam:  Normal osseous mineralization. Joint spaces preserved. No fracture/dislocation/boney destruction.     Assessment: #1 plantar wart left foot #2 pain in left foot #3 insertional peroneal tendinitis right    Plan of Care:  #1 Patient was evaluated. X-Rays reviewed.  #2 Excisional debridement of the plantar wart lesion was performed using a chisel blade. Cantharone was applied and the lesion was dressed with a dry  sterile dressing. #3 Injection of 0.5 mLs Celestone Soluspan injected into the insertion of the peroneal tendon sheath. Care was taken to avoid direct injection into the tendons. #4 Recommended good shoe gear. #5 patient is to return to clinic in 2 weeks  Felecia ShellingBrent M. Carlson Belland, DPM [facility]  Dr. Felecia ShellingBrent M. Crisanto Nied, DPM    [address]                                        [place], [place] [address]                Office [phone](336) [phone]  Fax [phone](336) [phone]

## 2017-08-17 ENCOUNTER — Ambulatory Visit: Payer: Managed Care, Other (non HMO) | Admitting: Podiatry

## 2017-11-13 ENCOUNTER — Ambulatory Visit (INDEPENDENT_AMBULATORY_CARE_PROVIDER_SITE_OTHER): Payer: Managed Care, Other (non HMO) | Admitting: Podiatry

## 2017-11-13 ENCOUNTER — Encounter: Payer: Self-pay | Admitting: Podiatry

## 2017-11-13 DIAGNOSIS — M7671 Peroneal tendinitis, right leg: Secondary | ICD-10-CM | POA: Diagnosis not present

## 2017-11-16 NOTE — Progress Notes (Signed)
   HPI: 58 year old female presenting today for follow up evaluation of insertional peroneal tendinitis of the right foot. She reports continued pain for the past three weeks. She states there is nothing that helps alleviate the pain. Patient is here for further evaluation and treatment.   Past Medical History:  Diagnosis Date  . Bacterial infection due to H. pylori      Physical Exam: General: The patient is alert and oriented x3 in no acute distress.  Dermatology: Skin is warm, dry and supple bilateral lower extremities. Negative for open lesions or macerations.  Vascular: Palpable pedal pulses bilaterally. No edema or erythema noted. Capillary refill within normal limits.  Neurological: Epicritic and protective threshold grossly intact bilaterally.   Musculoskeletal Exam: Pain with palpation to the insertion of the peroneal tendon of the right foot. Range of motion within normal limits to all pedal and ankle joints bilateral. Muscle strength 5/5 in all groups bilateral.     Assessment: 1. Insertional peroneal tendinitis right    Plan of Care:  1. Patient evaluated.   2. Injection of 0.5 mLs Celestone Soluspan injected into the peroneal tendon of the right foot.  3. Recommended good shoe gear.  4. Declines CAM boot or oral medication at this time.  5. Return to clinic as needed.      Felecia Shelling, DPM [facility]  Dr. Felecia Shelling, DPM    [address] [place], [place] [address]                Office [phone]  Fax [phone]

## 2017-12-11 ENCOUNTER — Encounter: Payer: Self-pay | Admitting: Podiatry

## 2017-12-11 ENCOUNTER — Ambulatory Visit (INDEPENDENT_AMBULATORY_CARE_PROVIDER_SITE_OTHER): Payer: Managed Care, Other (non HMO) | Admitting: Podiatry

## 2017-12-11 ENCOUNTER — Encounter

## 2017-12-11 DIAGNOSIS — M7671 Peroneal tendinitis, right leg: Secondary | ICD-10-CM

## 2017-12-18 NOTE — Progress Notes (Signed)
   HPI: 57 year old female presenting today for follow up evaluation of insertional peroneal tendinitis of the right foot. She states she is still having pain in the foot and is requesting another injection since it helped alleviate the pain for a short time. Patient is here for further evaluation and treatment.   Past Medical History:  Diagnosis Date  . Bacterial infection due to H. pylori      Physical Exam: General: The patient is alert and oriented x3 in no acute distress.  Dermatology: Skin is warm, dry and supple bilateral lower extremities. Negative for open lesions or macerations.  Vascular: Palpable pedal pulses bilaterally. No edema or erythema noted. Capillary refill within normal limits.  Neurological: Epicritic and protective threshold grossly intact bilaterally.   Musculoskeletal Exam: Pain with palpation to the insertion of the peroneal tendon of the right foot. Range of motion within normal limits to all pedal and ankle joints bilateral. Muscle strength 5/5 in all groups bilateral.     Assessment: 1. Insertional peroneal tendinitis right    Plan of Care:  1. Patient evaluated.   2. Injection of 0.5 mLs Celestone Soluspan injected into the peroneal tendon of the right foot.  3. Declines CAM boot or oral medication at this time.  4. Return to clinic as needed.      Felecia ShellingBrent M. Isaia Hassell, DPM [facility]  Dr. Felecia ShellingBrent M. Delainie Chavana, DPM    [address]                                        [place], [place] [address]                Office [phone](336) [phone]  Fax [phone]([phone]

## 2018-02-15 ENCOUNTER — Encounter: Payer: Self-pay | Admitting: Podiatry

## 2018-02-15 ENCOUNTER — Ambulatory Visit (INDEPENDENT_AMBULATORY_CARE_PROVIDER_SITE_OTHER): Payer: Managed Care, Other (non HMO) | Admitting: Podiatry

## 2018-02-15 DIAGNOSIS — M7671 Peroneal tendinitis, right leg: Secondary | ICD-10-CM | POA: Diagnosis not present

## 2018-02-15 NOTE — Progress Notes (Signed)
   HPI: 57 year old female presenting today for follow up evaluation of insertional peroneal tendinitis of the right foot. She reports the pain returned three weeks ago. She describes it as burning and pinching. Touching the foot and walking increases the pain. She has been soaking the foot in hot water with no significant relief. She states the last injection she received provided some temporary relief. Patient is here for further evaluation and treatment.   Past Medical History:  Diagnosis Date  . Bacterial infection due to H. pylori      Physical Exam: General: The patient is alert and oriented x3 in no acute distress.  Dermatology: Skin is warm, dry and supple bilateral lower extremities. Negative for open lesions or macerations.  Vascular: Palpable pedal pulses bilaterally. No edema or erythema noted. Capillary refill within normal limits.  Neurological: Epicritic and protective threshold grossly intact bilaterally.   Musculoskeletal Exam: Pain with palpation to the insertion of the peroneal tendon of the right foot. Range of motion within normal limits to all pedal and ankle joints bilateral. Muscle strength 5/5 in all groups bilateral.     Assessment: 1. Insertional peroneal tendinitis right    Plan of Care:  1. Patient evaluated.   2. Injection of 0.5 mLs Celestone Soluspan injected into the peroneal tendon of the right foot.  3. Prescription for antiinflammatory pain cream to be dispensed from [facility].  4. Return to clinic as needed.      Felecia ShellingBrent M. Costella Schwarz, DPM [facility]  Dr. Felecia ShellingBrent M. Morgen Linebaugh, DPM    [address]                                        [place], [place] [address]                Office [phone](336) [phone]  Fax [phone](336) [phone]

## 2018-02-27 ENCOUNTER — Telehealth: Payer: Self-pay | Admitting: Podiatry

## 2018-02-27 NOTE — Telephone Encounter (Signed)
I'm a pt of Dr. Logan BoresEvans' and I would like to get a copy of my medical records. I know I need to sign a medical records release form first. I can stop by and sign that if it would be quicker or not. Please call me back at [phone]. Thank you.

## 2018-03-01 ENCOUNTER — Encounter: Payer: Self-pay | Admitting: Obstetrics and Gynecology

## 2018-03-05 ENCOUNTER — Encounter: Payer: Self-pay | Admitting: Obstetrics and Gynecology

## 2018-03-05 ENCOUNTER — Ambulatory Visit (INDEPENDENT_AMBULATORY_CARE_PROVIDER_SITE_OTHER): Payer: Managed Care, Other (non HMO) | Admitting: Obstetrics and Gynecology

## 2018-03-05 VITALS — BP 126/78 | HR 70 | Ht 60.0 in | Wt 156.0 lb

## 2018-03-05 DIAGNOSIS — R198 Other specified symptoms and signs involving the digestive system and abdomen: Secondary | ICD-10-CM

## 2018-03-05 DIAGNOSIS — B9689 Other specified bacterial agents as the cause of diseases classified elsewhere: Secondary | ICD-10-CM | POA: Diagnosis not present

## 2018-03-05 DIAGNOSIS — N8111 Cystocele, midline: Secondary | ICD-10-CM | POA: Diagnosis not present

## 2018-03-05 DIAGNOSIS — N760 Acute vaginitis: Secondary | ICD-10-CM

## 2018-03-05 DIAGNOSIS — N951 Menopausal and female climacteric states: Secondary | ICD-10-CM | POA: Diagnosis not present

## 2018-03-05 NOTE — Progress Notes (Signed)
HPI:      Ms. Taylor Navarro is a 57 y.o. G2P0002 who LMP was No LMP recorded. (Menstrual status: Other).  Subjective:   She presents today with many issues.  She states that she went into menopause approximately 15 years ago and has not had any bleeding since that time.  She reports that she is not sexually active or sexually interested.   She recently began to see something pink bulging from the vagina.  She reports that in her daily work and at home she does a lot of heavy lifting.  She reports occasional urine loss but not much.  She complains of vaginal odor and thinks that she has "bacterial infection again".  She complains of vaginal burning and vaginal dryness.  She states that she did have hot flashes for a while but these seem to have resolved. She reports that she has a "kinked ureter" and gets a lot of bladder infections from it.  These seem to be under control at this time. Her main issue is that since a trip to [place] where she developed some type of GI disturbance she has been unable to get rid of it.  She complains of abdominal bloating occasional diarrhea and generalized weight gain since that time.  She reports that she has a GI doctor who has been addressing this issue without success.  She has an appointment today after this one.  She also states that she previously had H. pylori and she is concerned that this has also recurred. She is unsure of her current status as far as routine health maintenance.  She believes it has been more than a year and a half for mammogram Pap smear and routine blood work.  She reports she has never had a DEXA scan.  She reports she does not know if she ever had a colonoscopy.    Hx: The following portions of the patient's history were reviewed and updated as appropriate:             She  has a past medical history of Bacterial infection due to H. pylori. She does not have any pertinent problems on file. She  has a past surgical history that includes  Knee surgery; ovarian (2004); and Tubal ligation (1999). Her family history includes Cancer in her father and mother; Leukemia in her father; Lung cancer in her mother. She  reports that she has never smoked. She has never used smokeless tobacco. She reports that she drinks alcohol. She reports that she does not use drugs. She has a current medication list which includes the following prescription(s): clotrimazole-betamethasone, lansoprazole, lidocaine-prilocaine, metronidazole, metronidazole, and metronidazole. She is allergic to bupivacaine; cefpodoxime; cephalexin; sulfa antibiotics; and sulfonamide derivatives.       Review of Systems:  Review of Systems  Constitutional: Denied constitutional symptoms, night sweats, recent illness, fatigue, fever, insomnia and weight loss.  Eyes: Denied eye symptoms, eye pain, photophobia, vision change and visual disturbance.  Ears/Nose/Throat/Neck: Denied ear, nose, throat or neck symptoms, hearing loss, nasal discharge, sinus congestion and sore throat.  Cardiovascular: Denied cardiovascular symptoms, arrhythmia, chest pain/pressure, edema, exercise intolerance, orthopnea and palpitations.  Respiratory: Denied pulmonary symptoms, asthma, pleuritic pain, productive sputum, cough, dyspnea and wheezing.  Gastrointestinal: See HPI for additional information.  Genitourinary: See HPI for additional information.  Musculoskeletal: Denied musculoskeletal symptoms, stiffness, swelling, muscle weakness and myalgia.  Dermatologic: Denied dermatology symptoms, rash and scar.  Neurologic: Denied neurology symptoms, dizziness, headache, neck pain and syncope.  Psychiatric: Denied  psychiatric symptoms, anxiety and depression.  Endocrine: Denied endocrine symptoms including hot flashes and night sweats.   Meds:   Current Outpatient Medications on File Prior to Visit  Medication Sig Dispense Refill  . clotrimazole-betamethasone (LOTRISONE) cream Apply 1 application  topically 2 (two) times daily. (Patient not taking: Reported on 03/05/2018) 30 g 2  . lansoprazole (PREVACID) 15 MG capsule Take by mouth.    . lidocaine-prilocaine (EMLA) cream Apply as needed on the painful area    . metroNIDAZOLE (FLAGYL) 500 MG tablet Take 1 tablet (500 mg total) by mouth 3 (three) times daily. (Patient not taking: Reported on 03/05/2018) 30 tablet 0  . metroNIDAZOLE (METROGEL) 0.75 % vaginal gel INSERT 1 APPLICATORFUL OF GEL INTRAVAGINALLY NIGHTLY AT BEDTIME X 5 NIGHTS  0   No current facility-administered medications on file prior to visit.     Objective:     Vitals:   03/05/18 1343  BP: 126/78  Pulse: 70              Physical examination   Pelvic:   Vulva: Normal appearance.  No lesions.  Vagina: No lesions or abnormalities noted.  Mild vaginal atrophy  Support:  Second-degree cystocele.  Urethra No masses tenderness or scarring.  Meatus Normal size without lesions or prolapse.  Cervix: Normal appearance.  No lesions.  Anus: Normal exam.  No lesions.  Perineum: Normal exam.  No lesions.        Bimanual   Uterus:  Small non-tender.  Mobile.  AV.  Adnexae: Bilateral adnexal tenderness.  No masses palpated  Cul-de-sac: Negative for abnormality.   WET PREP: clue cells: present, KOH (yeast): negative, odor: present and trichomoniasis: negative Ph:  < 4.5   Assessment:    G2P0002 Patient Active Problem List   Diagnosis Date Noted  . Primary osteoarthritis of right knee 08/05/2014  . DEGENERATIVE JOINT DISEASE, RIGHT KNEE 07/29/2009  . KNEE PAIN, RIGHT 07/29/2009  . GOUT 03/10/2009  . SHOULDER PAIN 03/10/2009  . HIP PAIN, RIGHT 03/10/2009  . BACK PAIN, LUMBAR 03/10/2009  . URI 07/07/2008  . SEBACEOUS CYST, NECK 07/07/2008  . HEADACHE 01/10/2008  . ALLERGIC RHINITIS 07/24/2007  . GERD 07/24/2007  . URINARY TRACT INFECTION, RECURRENT 07/22/2007  . 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 HOT FLASHES 04/16/2007     1. Bacterial vulvovaginitis   2.  Symptomatic menopausal or female climacteric states   3. Cystocele, midline   4. GI symptom     Delinquent in general routine maintenance testing.   Plan:            1.  Flagyl for BV.  Discussed use of probiotics and prevention of infection.  2.  Strongly recommend GI follow-up today and continued work-up.  If that proves negative and this has not been performed I would strongly consider pelvic ultrasound with attention ovaries.  3.  Discussed use of estrogen vaginal cream for vaginal atrophy and dryness-patient declined.  4.  Discussed routine maintenance testing and examinations with special emphasis on mammography DEXA colonoscopy and Pap smear.  Patient will return for annual examination within the next 2 months. Orders No orders of the defined types were placed in this encounter.   No orders of the defined types were placed in this encounter.     F/U  Return in about 1 month (around 04/04/2018) for Annual Physical. I spent 34 minutes involved in the care of this patient of which greater than 50% was spent discussing see above notes detailing discussions.  Elonda Husky, M.D. 03/06/2018 9:27 AM

## 2018-03-05 NOTE — Progress Notes (Signed)
Pt presents today with swelling and pain in in abdomen and vaginal area. Pt states a "bulge" she can feel in her vagina. Pt also complains of vaginal odor.

## 2018-03-06 ENCOUNTER — Other Ambulatory Visit: Payer: Self-pay

## 2018-03-06 ENCOUNTER — Telehealth: Payer: Self-pay

## 2018-03-06 ENCOUNTER — Telehealth: Payer: Self-pay | Admitting: Obstetrics and Gynecology

## 2018-03-06 DIAGNOSIS — B9689 Other specified bacterial agents as the cause of diseases classified elsewhere: Secondary | ICD-10-CM

## 2018-03-06 DIAGNOSIS — N760 Acute vaginitis: Principal | ICD-10-CM

## 2018-03-06 MED ORDER — METRONIDAZOLE 500 MG PO TABS: 500.0000 mg | ORAL_TABLET | Freq: Two times a day (BID) | ORAL | 0 refills | Status: DC

## 2018-03-06 NOTE — Telephone Encounter (Signed)
Called to inform Pt that Rx had been resent. There was no answer, will try again later.

## 2018-03-06 NOTE — Telephone Encounter (Signed)
The patient was in yesterday and went to get her script for Flagyl, but it was not called into her pharmacy.  She is out running errands this morning and would like to pick up her medication, please advise, thanks.

## 2018-03-06 NOTE — Telephone Encounter (Signed)
Informed Pt that Rx of Flagyl has been sent to [facility] on [address]

## 2018-03-11 ENCOUNTER — Telehealth: Payer: Self-pay | Admitting: Obstetrics and Gynecology

## 2018-03-11 NOTE — Telephone Encounter (Signed)
The patient is having recurrent symptoms and 3 days left of the script that was just sent in for the Flagyl.  She is stating that the Flagyl is apparently "not strong enough" and is asking for the nurse/provider to send in something [facility] on [address] please advise, thanks.

## 2018-03-12 ENCOUNTER — Telehealth: Payer: Self-pay | Admitting: Obstetrics and Gynecology

## 2018-03-12 NOTE — Telephone Encounter (Signed)
Informed Pt that her issue had been forwarded to [facility] and that I would update her as soon as possible.

## 2018-03-12 NOTE — Telephone Encounter (Signed)
He patient called and stated that the prescription she was prescribed for bacterial vaginosis is not working, the patient stated that she is still experiencing itching, Cold chills, numbing pain w/ lower pelvic pain. The patient is hoping to get another prescription asap. The patient would like a call back from a nurse as soon as possible due to her not receiving a call back from her last message on 03/06/2018. Please advise.

## 2018-03-13 NOTE — Telephone Encounter (Signed)
Pt complains of upset stomach, itchy skin, hot flashes and vaginal odor. She believes it is due to the Flagyl antibiotic and claims the medication is not working and she needs something stronger. Per DJE, pt was advised that her symptoms were not related to BV and she should contact her GI doctor. Also advised to use Monistat for her vaginal issues. Pt is still requesting that DJE change her medication.

## 2018-03-14 MED ORDER — METRONIDAZOLE 0.75 % VA GEL: Freq: Every day | VAGINAL | 0 refills | Status: DC

## 2018-03-14 NOTE — Telephone Encounter (Signed)
Spoke with patient -she states she is unable to complete her oral flagyl. She only completed 4 days worth due to indigestion and gas.  She is requesting something else. She states her tummy issues are much better since starting Protonix per her GI several days ago. I informed pt that DJE was ok to erx metrogel per vagina for 5 nights. Advised if this did not help to make a f/u appt with DJE. She states she will. She mention some ongoing bladder concerns I advised her we could see her for some bladder issues or if she has a urologist she may seen them or we could refer her to one. Pt did not mention any negative issues with her visit or wanting to switch providers.

## 2018-03-14 NOTE — Telephone Encounter (Signed)
The patient called back asking why she had not been contacted about a different medication for her BV, and stated she is having issues with her BV medication also causing reflux symptoms even on Protonix.  She stated she is even willing to do a suppository for the BV.  She then mentioned wanting to switch providers to Dr. Valentino Saxonherry; but she was told Dr. Valentino Saxonherry is not taking new patients at this time; however, we do have midwives on staff, but that change would have to be approved with the office manager, Crystal.  The patient was also informed that Dr. Logan BoresEvans has been in surgery this week and limited schedule at the office, and would be out of the office tomorrow, Friday, 03/15/18.  She stated that Dr. Logan BoresEvans was inappropriate on her last visit and when he came into the room he asked, "well, do you wanna have sex?" and stated, "he has the bedside manner of a frog!" and the patient was told someone would get back with her today about her medication and the possibility of switching providers.  Please advise, thanks.

## 2018-04-06 ENCOUNTER — Encounter: Payer: Self-pay | Admitting: Emergency Medicine

## 2018-04-06 ENCOUNTER — Other Ambulatory Visit: Payer: Self-pay

## 2018-04-06 ENCOUNTER — Emergency Department
Admission: EM | Admit: 2018-04-06 | Discharge: 2018-04-06 | Disposition: A | Discharge status: Home / Self Care | Payer: Managed Care, Other (non HMO) | Attending: Emergency Medicine | Admitting: Emergency Medicine

## 2018-04-06 DIAGNOSIS — Z79899 Other long term (current) drug therapy: Secondary | ICD-10-CM | POA: Diagnosis not present

## 2018-04-06 DIAGNOSIS — K921 Melena: Secondary | ICD-10-CM | POA: Diagnosis not present

## 2018-04-06 DIAGNOSIS — R1013 Epigastric pain: Secondary | ICD-10-CM | POA: Diagnosis present

## 2018-04-06 LAB — CBC
HEMATOCRIT: 42.2 % (ref 35.0–47.0)
HEMOGLOBIN: 13.9 g/dL (ref 12.0–16.0)
MCH: 28.8 pg (ref 26.0–34.0)
MCHC: 33 g/dL (ref 32.0–36.0)
MCV: 87.5 fL (ref 80.0–100.0)
Platelets: 301 10*3/uL (ref 150–440)
RBC: 4.83 MIL/uL (ref 3.80–5.20)
RDW: 13.2 % (ref 11.5–14.5)
WBC: 7.8 10*3/uL (ref 3.6–11.0)

## 2018-04-06 LAB — TYPE AND SCREEN
ABO/RH(D): A POS
Antibody Screen: NEGATIVE

## 2018-04-06 NOTE — ED Notes (Signed)

## 2018-04-06 NOTE — ED Triage Notes (Signed)
2 episodes of loose stool that were black starting at 4 am.  Colonoscopy yesterday.  Pain/bloating to lower abdomen and back per pt. No vomiting.  Has been hot. Ambulatory, NAD

## 2018-04-06 NOTE — ED Notes (Signed)
Received call from lab that light green tube hemolyzed. Per EDP Lord, test may be cancelled at this time.

## 2018-04-06 NOTE — Discharge Instructions (Addendum)
You are evaluated for black diarrhea after colonoscopy, and as we discussed, I suspect there may have been some blood there although your rectal testing was negative for blood here in the emergency room.  Make sure you drink plenty fluids.  Return to the emergency department immediately for any new or worsening abdominal pain, fever, bloody stool, nausea, your concern for dehydration such as dry mouth or not making urine, any dizziness or passing out, or any other symptoms concerning to you.

## 2018-04-06 NOTE — ED Provider Notes (Signed)
[facility] Emergency Department Provider Note ____________________________________________   I have reviewed the triage vital signs and the triage nursing note.  HISTORY  Chief Complaint Abdominal Pain   Historian Patient  HPI Taylor Navarro is a 57 y.o. female with a history of what sounds like essentially chronic abdominal pain and bloating over a year, worse over the past several months, followed by GI and had a colonoscopy and endoscopy with Dr. Norma Fredrickson yesterday.  She had a biopsy of diverticulosis during the procedure.  States that she felt good when she left for the procedure yesterday.  Overnight she woke up with abdominal pain which is epigastric and slightly right-sided which is similar to the abdominal pain and bloating that she has been dealing with, no worse than the chronic symptoms that she has.  She had 2 episodes of dark/black diarrhea.  She called the on-call GI doctor Dr. Mechele Collin who did recommend ED for evaluation.  No fever.  No dizziness passing out.     Past Medical History:  Diagnosis Date  . Bacterial infection due to H. pylori     Patient Active Problem List   Diagnosis Date Noted  . Primary osteoarthritis of right knee 08/05/2014  . DEGENERATIVE JOINT DISEASE, RIGHT KNEE 07/29/2009  . KNEE PAIN, RIGHT 07/29/2009  . GOUT 03/10/2009  . SHOULDER PAIN 03/10/2009  . HIP PAIN, RIGHT 03/10/2009  . BACK PAIN, LUMBAR 03/10/2009  . URI 07/07/2008  . SEBACEOUS CYST, NECK 07/07/2008  . HEADACHE 01/10/2008  . ALLERGIC RHINITIS 07/24/2007  . GERD 07/24/2007  . URINARY TRACT INFECTION, RECURRENT 07/22/2007  . 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 HOT FLASHES 04/16/2007    Past Surgical History:  Procedure Laterality Date  . KNEE SURGERY    . ovarian  2004   scar tissue removal  . TUBAL LIGATION  1999    Prior to Admission medications   Medication Sig Start Date End Date Taking? Authorizing Provider   clotrimazole-betamethasone (LOTRISONE) cream Apply 1 application topically 2 (two) times daily. Patient not taking: Reported on 03/05/2018 01/26/17   Felecia Shelling, DPM  lansoprazole (PREVACID) 15 MG capsule Take by mouth. 02/19/17   [provider]  lidocaine-prilocaine (EMLA) cream Apply as needed on the painful area 08/09/17   [provider]  metroNIDAZOLE (FLAGYL) 500 MG tablet Take 1 tablet (500 mg total) by mouth 3 (three) times daily. Patient not taking: Reported on 03/05/2018 07/02/16   Sharyn Creamer, MD  metroNIDAZOLE (FLAGYL) 500 MG tablet Take 1 tablet (500 mg total) by mouth 2 (two) times daily. 03/06/18   Linzie Collin, MD  metroNIDAZOLE (METROGEL) 0.75 % vaginal gel Place vaginally at bedtime. For five nights 03/14/18   Linzie Collin, MD    Allergies  Allergen Reactions  . Bupivacaine Nausea Only    REACTION: flushing, sweating, nausea REACTION: flushing, sweating, nausea  . Cefpodoxime Other (See Comments)    Note: Causes Collitis  . Cephalexin Other (See Comments)    REACTION: GI REACTION: GI  . Sulfa Antibiotics Nausea And Vomiting and Swelling    REACTION: swelling  . Sulfonamide Derivatives     REACTION: swelling    Family History  Problem Relation Age of Onset  . Lung cancer Mother   . Cancer Mother   . Leukemia Father   . Cancer Father     Social History Social History   Tobacco Use  . Smoking status: Never Smoker  . Smokeless tobacco: Never Used  Substance Use  Topics  . Alcohol use: Yes  . Drug use: No    Review of Systems  Constitutional: Negative for fever. Eyes: Negative for visual changes. ENT: Negative for sore throat. Cardiovascular: Negative for chest pain. Respiratory: Negative for shortness of breath. Gastrointestinal: Negative for vomiting.   Genitourinary: Chronic intermittent dysuria. Musculoskeletal: Negative for back pain. Skin: Negative for rash. Neurological: Negative for  headache.  ____________________________________________   PHYSICAL EXAM:  VITAL SIGNS: ED Triage Vitals  Enc Vitals Group     BP 04/06/18 0843 (!) 153/86     Pulse Rate 04/06/18 0843 71     Resp 04/06/18 0843 16     Temp 04/06/18 0843 98 F (36.7 C)     Temp Source 04/06/18 0843 Oral     SpO2 04/06/18 0843 94 %     Weight 04/06/18 0839 157 lb (71.2 kg)     Height 04/06/18 0839 5\' 1"  (1.549 m)     Head Circumference --      Peak Flow --      Pain Score 04/06/18 0839 3     Pain Loc --      Pain Edu? --      Excl. in GC? --      Constitutional: Alert and oriented.  HEENT      Head: Normocephalic and atraumatic.      Eyes: Conjunctivae are normal. Pupils equal and round.       Ears:         Nose: No congestion/rhinnorhea.      Mouth/Throat: Mucous membranes are moist.      Neck: No stridor. Cardiovascular/Chest: Normal rate, regular rhythm.  No murmurs, rubs, or gallops. Respiratory: Normal respiratory effort without tachypnea nor retractions. Breath sounds are clear and equal bilaterally. No wheezes/rales/rhonchi. Gastrointestinal: Soft. No distention, no guarding, no rebound.  Mild tenderness in the mid abdomen. Genitourinary/rectal: Mild irritation the skin outside the anus.  No stool on digital exam, but secretions test Hemoccult negative. Musculoskeletal: Nontender with normal range of motion in all extremities. No joint effusions.  No lower extremity tenderness.  No edema. Neurologic:  Normal speech and language. No gross or focal neurologic deficits are appreciated. Skin:  Skin is warm, dry and intact. No rash noted. Psychiatric: Mood and affect are normal. Speech and behavior are normal. Patient exhibits appropriate insight and judgment.   ____________________________________________  LABS (pertinent positives/negatives) I, Governor Rooks, MD the attending physician have reviewed the labs noted below.  Labs Reviewed  CBC  TYPE AND SCREEN     ____________________________________________    EKG I, Governor Rooks, MD, the attending physician have personally viewed and interpreted all ECGs.  None ____________________________________________  RADIOLOGY   None __________________________________________  PROCEDURES  Procedure(s) performed: None  Procedures  Critical Care performed: None   ____________________________________________  ED COURSE / ASSESSMENT AND PLAN  Pertinent labs & imaging results that were available during my care of the patient were reviewed by me and considered in my medical decision making (see chart for details).    I was able to review the patient's colonoscopy report which indicated biopsy of diverticula's.  Patient is overall well-appearing.  No clinical dehydration.  Tolerating p.o.  She has chronic abdominal pain and bloating and states that the pain is no better after the colonoscopy and endoscopy, but it is not any worse.  The reason she came was 2 episodes of black diarrhea.  Hemoccult testing here is just of secretions and is negative.  Ultimately we discussed, likely  would have been related to bleeding from the biopsy site, but does not appear to be significant GI bleeding.  No recent hemoglobin, but he will and today is 13.9.  There is been hypertension.  No other terms of volume depletion.  Patient is very well educated and comfortable monitoring for any new or worsening symptoms and returning if needed as we discussed.  CMP had hemolyzed, but I do not think that we necessarily need this and patient states she would rather go home and hydrate orally.  CONSULTATIONS: None   Patient / Family / Caregiver informed of clinical course, medical decision-making process, and agree with plan.   I discussed return precautions, follow-up instructions, and discharge instructions with patient and/or family.  Discharge Instructions : You are evaluated for black diarrhea after  colonoscopy, and as we discussed, I suspect there may have been some blood there although your rectal testing was negative for blood here in the emergency room.  Make sure you drink plenty fluids.  Return to the emergency department immediately for any new or worsening abdominal pain, fever, bloody stool, nausea, your concern for dehydration such as dry mouth or not making urine, any dizziness or passing out, or any other symptoms concerning to you.    ___________________________________________   FINAL CLINICAL IMPRESSION(S) / ED DIAGNOSES   Final diagnoses:  Black stool      ___________________________________________         Note: This dictation was prepared with Dragon dictation. Any transcriptional errors that result from this process are unintentional    Governor Rooks, MD 04/06/18 1028

## 2018-04-10 ENCOUNTER — Other Ambulatory Visit: Payer: Self-pay | Admitting: Physician Assistant

## 2018-04-10 DIAGNOSIS — M7542 Impingement syndrome of left shoulder: Secondary | ICD-10-CM

## 2018-04-21 ENCOUNTER — Ambulatory Visit
Admission: RE | Admit: 2018-04-21 | Discharge: 2018-04-21 | Disposition: A | Discharge status: Home / Self Care | Payer: Managed Care, Other (non HMO) | Source: Ambulatory Visit | Attending: Physician Assistant | Admitting: Physician Assistant

## 2018-04-21 DIAGNOSIS — M7542 Impingement syndrome of left shoulder: Secondary | ICD-10-CM | POA: Diagnosis present

## 2018-04-21 DIAGNOSIS — M75102 Unspecified rotator cuff tear or rupture of left shoulder, not specified as traumatic: Secondary | ICD-10-CM | POA: Diagnosis not present

## 2018-04-21 DIAGNOSIS — M25561 Pain in right knee: Secondary | ICD-10-CM | POA: Insufficient documentation

## 2018-04-21 DIAGNOSIS — M7552 Bursitis of left shoulder: Secondary | ICD-10-CM | POA: Insufficient documentation

## 2018-05-02 DIAGNOSIS — M79676 Pain in unspecified toe(s): Secondary | ICD-10-CM

## 2018-06-03 ENCOUNTER — Encounter: Payer: Self-pay | Admitting: Medical Oncology

## 2018-06-03 ENCOUNTER — Emergency Department: Payer: Managed Care, Other (non HMO)

## 2018-06-03 ENCOUNTER — Emergency Department
Admission: EM | Admit: 2018-06-03 | Discharge: 2018-06-03 | Disposition: A | Discharge status: Home / Self Care | Payer: Managed Care, Other (non HMO) | Attending: Emergency Medicine | Admitting: Emergency Medicine

## 2018-06-03 DIAGNOSIS — K529 Noninfective gastroenteritis and colitis, unspecified: Secondary | ICD-10-CM | POA: Diagnosis not present

## 2018-06-03 DIAGNOSIS — R1084 Generalized abdominal pain: Secondary | ICD-10-CM | POA: Diagnosis not present

## 2018-06-03 DIAGNOSIS — R109 Unspecified abdominal pain: Secondary | ICD-10-CM | POA: Diagnosis present

## 2018-06-03 DIAGNOSIS — R112 Nausea with vomiting, unspecified: Secondary | ICD-10-CM | POA: Diagnosis not present

## 2018-06-03 HISTORY — DX: Gastritis, unspecified, without bleeding: K29.70

## 2018-06-03 LAB — COMPREHENSIVE METABOLIC PANEL
ALT: 13 U/L (ref 0–44)
AST: 23 U/L (ref 15–41)
Albumin: 4.2 g/dL (ref 3.5–5.0)
Alkaline Phosphatase: 57 U/L (ref 38–126)
Anion gap: 10 (ref 5–15)
BILIRUBIN TOTAL: 1.1 mg/dL (ref 0.3–1.2)
BUN: 20 mg/dL (ref 6–20)
CHLORIDE: 105 mmol/L (ref 98–111)
CO2: 26 mmol/L (ref 22–32)
Calcium: 9 mg/dL (ref 8.9–10.3)
Creatinine, Ser: 0.8 mg/dL (ref 0.44–1.00)
Glucose, Bld: 88 mg/dL (ref 70–99)
POTASSIUM: 3.7 mmol/L (ref 3.5–5.1)
Sodium: 141 mmol/L (ref 135–145)
TOTAL PROTEIN: 7.2 g/dL (ref 6.5–8.1)

## 2018-06-03 LAB — CBC
HEMATOCRIT: 46.8 % — AB (ref 36.0–46.0)
HEMOGLOBIN: 15.5 g/dL — AB (ref 12.0–15.0)
MCH: 29 pg (ref 26.0–34.0)
MCHC: 33.1 g/dL (ref 30.0–36.0)
MCV: 87.6 fL (ref 80.0–100.0)
NRBC: 0 % (ref 0.0–0.2)
Platelets: 315 10*3/uL (ref 150–400)
RBC: 5.34 MIL/uL — ABNORMAL HIGH (ref 3.87–5.11)
RDW: 12.5 % (ref 11.5–15.5)
WBC: 9.8 10*3/uL (ref 4.0–10.5)

## 2018-06-03 LAB — LIPASE, BLOOD: LIPASE: 24 U/L (ref 11–51)

## 2018-06-03 LAB — URINALYSIS, COMPLETE (UACMP) WITH MICROSCOPIC
BACTERIA UA: NONE SEEN
Bilirubin Urine: NEGATIVE
GLUCOSE, UA: NEGATIVE mg/dL
HGB URINE DIPSTICK: NEGATIVE
Ketones, ur: 20 mg/dL — AB
LEUKOCYTES UA: NEGATIVE
NITRITE: NEGATIVE
PH: 5 (ref 5.0–8.0)
PROTEIN: NEGATIVE mg/dL
Specific Gravity, Urine: 1.027 (ref 1.005–1.030)

## 2018-06-03 MED ORDER — METOCLOPRAMIDE HCL 10 MG PO TABS: 10.0000 mg | ORAL_TABLET | Freq: Four times a day (QID) | ORAL | 0 refills | Status: DC | PRN

## 2018-06-03 MED ORDER — IOPAMIDOL (ISOVUE-300) INJECTION 61%
100.0000 mL | Freq: Once | INTRAVENOUS | Status: AC | PRN
  Administered 2018-06-03: 100 mL via INTRAVENOUS

## 2018-06-03 MED ORDER — METOCLOPRAMIDE HCL 5 MG/ML IJ SOLN
10.0000 mg | Freq: Once | INTRAMUSCULAR | Status: AC
  Administered 2018-06-03: 10 mg via INTRAVENOUS
  Filled 2018-06-03: qty 2

## 2018-06-03 MED ORDER — DEXTROSE-NACL 5-0.45 % IV SOLN
Freq: Once | INTRAVENOUS | Status: AC
  Administered 2018-06-03: 11:00:00 via INTRAVENOUS

## 2018-06-03 MED ORDER — FAMOTIDINE 20 MG PO TABS: 20.0000 mg | ORAL_TABLET | Freq: Two times a day (BID) | ORAL | 0 refills | Status: DC

## 2018-06-03 MED ORDER — ALUM & MAG HYDROXIDE-SIMETH 200-200-20 MG/5ML PO SUSP
15.0000 mL | Freq: Once | ORAL | Status: AC
  Administered 2018-06-03: 15 mL via ORAL
  Filled 2018-06-03: qty 30

## 2018-06-03 MED ORDER — CIPROFLOXACIN HCL 500 MG PO TABS: 500.0000 mg | ORAL_TABLET | Freq: Two times a day (BID) | ORAL | 0 refills | Status: DC

## 2018-06-03 MED ORDER — METRONIDAZOLE 500 MG PO TABS: 500.0000 mg | ORAL_TABLET | Freq: Three times a day (TID) | ORAL | 0 refills | Status: DC

## 2018-06-03 MED ORDER — FAMOTIDINE IN NACL 20-0.9 MG/50ML-% IV SOLN
20.0000 mg | Freq: Once | INTRAVENOUS | Status: AC
  Administered 2018-06-03: 20 mg via INTRAVENOUS
  Filled 2018-06-03: qty 50

## 2018-06-03 NOTE — ED Provider Notes (Signed)
[facility] Emergency Department Provider Note  ____________________________________________  Time seen: Approximately 12:26 PM  I have reviewed the triage vital signs and the nursing notes.   HISTORY  Chief Complaint Abdominal Pain; Nausea; and Emesis    HPI Taylor Navarro is a 57 y.o. female with a history of H. pylori infection and gastritis who comes the ED complaining of right sided periumbilical pain that radiates to the right abdomen and right lower quadrant.  No prior abdominal surgeries, but she has a long history of recurrent abdominal pain and extensive work-up by gastroenterology.  Pain is constant, no aggravating or alleviating factors, associated nausea vomiting.  Radiates as above.  Moderate to severe in intensity, aching.  Denies diarrhea or constipation.   Past Medical History:  Diagnosis Date  . Bacterial infection due to H. pylori   . Gastritis      Patient Active Problem List   Diagnosis Date Noted  . Primary osteoarthritis of right knee 08/05/2014  . DEGENERATIVE JOINT DISEASE, RIGHT KNEE 07/29/2009  . KNEE PAIN, RIGHT 07/29/2009  . GOUT 03/10/2009  . SHOULDER PAIN 03/10/2009  . HIP PAIN, RIGHT 03/10/2009  . BACK PAIN, LUMBAR 03/10/2009  . URI 07/07/2008  . SEBACEOUS CYST, NECK 07/07/2008  . HEADACHE 01/10/2008  . ALLERGIC RHINITIS 07/24/2007  . GERD 07/24/2007  . URINARY TRACT INFECTION, RECURRENT 07/22/2007  . 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 HOT FLASHES 04/16/2007     Past Surgical History:  Procedure Laterality Date  . KNEE SURGERY    . ovarian  2004   scar tissue removal  . TUBAL LIGATION  1999     Prior to Admission medications   Medication Sig Start Date End Date Taking? Authorizing Provider  ciprofloxacin (CIPRO) 500 MG tablet Take 1 tablet (500 mg total) by mouth 2 (two) times daily. 06/03/18   Sharman Cheek, MD  clotrimazole-betamethasone (LOTRISONE) cream Apply 1 application topically 2 (two)  times daily. Patient not taking: Reported on 03/05/2018 01/26/17   Felecia Shelling, DPM  famotidine (PEPCID) 20 MG tablet Take 1 tablet (20 mg total) by mouth 2 (two) times daily. 06/03/18   Sharman Cheek, MD  lansoprazole (PREVACID) 15 MG capsule Take by mouth. 02/19/17   [provider]  lidocaine-prilocaine (EMLA) cream Apply as needed on the painful area 08/09/17   [provider]  metoCLOPramide (REGLAN) 10 MG tablet Take 1 tablet (10 mg total) by mouth every 6 (six) hours as needed. 06/03/18   Sharman Cheek, MD  metroNIDAZOLE (FLAGYL) 500 MG tablet Take 1 tablet (500 mg total) by mouth 3 (three) times daily. 06/03/18   Sharman Cheek, MD  metroNIDAZOLE (METROGEL) 0.75 % vaginal gel Place vaginally at bedtime. For five nights 03/14/18   Linzie Collin, MD     Allergies Bupivacaine; Cefpodoxime; Cephalexin; Sulfa antibiotics; and Sulfonamide derivatives   Family History  Problem Relation Age of Onset  . Lung cancer Mother   . Cancer Mother   . Leukemia Father   . Cancer Father     Social History Social History   Tobacco Use  . Smoking status: Never Smoker  . Smokeless tobacco: Never Used  Substance Use Topics  . Alcohol use: Yes  . Drug use: No    Review of Systems  Constitutional:   No fever or chills.  ENT:   No sore throat. No rhinorrhea. Cardiovascular:   No chest pain or syncope. Respiratory:   No dyspnea or cough. Gastrointestinal:   Positive as above  for abdominal pain and vomiting Musculoskeletal:   Negative for focal pain or swelling All other systems reviewed and are negative except as documented above in ROS and HPI.  ____________________________________________   PHYSICAL EXAM:  VITAL SIGNS: ED Triage Vitals [06/03/18 1017]  Enc Vitals Group     BP (!) 127/52     Pulse Rate 83     Resp 17     Temp      Temp src      SpO2 99 %     Weight 150 lb (68 kg)     Height 5' (1.524 m)     Head Circumference      Peak Flow       Pain Score 8     Pain Loc      Pain Edu?      Excl. in GC?     Vital signs reviewed, nursing assessments reviewed.   Constitutional:   Alert and oriented. Non-toxic appearance. Eyes:   Conjunctivae are normal. EOMI. PERRL. ENT      Head:   Normocephalic and atraumatic.      Nose:   No congestion/rhinnorhea.       Mouth/Throat:   MMM, no pharyngeal erythema. No peritonsillar mass.       Neck:   No meningismus. Full ROM. Hematological/Lymphatic/Immunilogical:   No cervical lymphadenopathy. Cardiovascular:   RRR. Symmetric bilateral radial and DP pulses.  No murmurs. Cap refill less than 2 seconds. Respiratory:   Normal respiratory effort without tachypnea/retractions. Breath sounds are clear and equal bilaterally. No wheezes/rales/rhonchi. Gastrointestinal:   Soft with right mid and lower abdominal tenderness.  Somewhat generalized tenderness, nonfocal.. Non distended. There is no CVA tenderness.  No rebound, rigidity, or guarding.  Musculoskeletal:   Normal range of motion in all extremities. No joint effusions.  No lower extremity tenderness.  No edema. Neurologic:   Normal speech and language.  Motor grossly intact. No acute focal neurologic deficits are appreciated.  Skin:    Skin is warm, dry and intact. No rash noted.  No petechiae, purpura, or bullae.  ____________________________________________    LABS (pertinent positives/negatives) (all labs ordered are listed, but only abnormal results are displayed) Labs Reviewed  CBC - Abnormal; Notable for the following components:      Result Value   RBC 5.34 (*)    Hemoglobin 15.5 (*)    HCT 46.8 (*)    All other components within normal limits  URINALYSIS, COMPLETE (UACMP) WITH MICROSCOPIC - Abnormal; Notable for the following components:   Color, Urine YELLOW (*)    APPearance CLOUDY (*)    Ketones, ur 20 (*)    All other components within normal limits  LIPASE, BLOOD  COMPREHENSIVE METABOLIC PANEL    ____________________________________________   EKG    ____________________________________________    RADIOLOGY  Ct Abdomen Pelvis W Contrast  Result Date: 06/03/2018 CLINICAL DATA:  Right upper quadrant abdominal pain with nausea and vomiting. EXAM: CT ABDOMEN AND PELVIS WITH CONTRAST TECHNIQUE: Multidetector CT imaging of the abdomen and pelvis was performed using the standard protocol following bolus administration of intravenous contrast. CONTRAST:  ISOVUE-300 IOPAMIDOL (ISOVUE-300) INJECTION 61% COMPARISON:  CT abdomen pelvis dated February 28, 2017. FINDINGS: Lower chest: No acute abnormality. Hepatobiliary: No focal liver abnormality is seen. No gallstones, gallbladder wall thickening, or biliary dilatation. Pancreas: Unremarkable. No pancreatic ductal dilatation or surrounding inflammatory changes. Spleen: Normal in size without focal abnormality. Adrenals/Urinary Tract: Adrenal glands are unremarkable. Kidneys are normal, without renal calculi,  focal lesion, or hydronephrosis. Bladder is decompressed. Stomach/Bowel: There are multiple loops of small bowel with prominent circumferential wall thickening and mucosal enhancement. Borderline dilated loop of inflamed small bowel in the left abdomen. No bowel obstruction. The stomach and colon are unremarkable. Normal appendix. Vascular/Lymphatic: No significant vascular findings are present. No enlarged abdominal or pelvic lymph nodes. Reproductive: Uterus and bilateral adnexa are unremarkable. Prior tubal ligation. Other: Small amount of free fluid in the pelvis. No pneumoperitoneum. Musculoskeletal: No acute or significant osseous findings. IMPRESSION: 1. Multiple severely inflamed small bowel loops, consistent with enteritis. Small volume reactive mesenteric and pelvic ascites without pneumatosis or pneumoperitoneum. 2. Single borderline dilated loop of inflamed small bowel in the left abdomen. No definite evidence of obstruction.  Electronically Signed   By: Obie DredgeWilliam T Derry M.D.   On: 06/03/2018 12:26    ____________________________________________   PROCEDURES Procedures  ____________________________________________  DIFFERENTIAL DIAGNOSIS   Appendicitis, diverticulitis, colitis, cholecystitis, pancreatitis, bowel obstruction, constipation  CLINICAL IMPRESSION / ASSESSMENT AND PLAN / ED COURSE  Pertinent labs & imaging results that were available during my care of the patient were reviewed by me and considered in my medical decision making (see chart for details).    Patient presents with somewhat nonfocal abdominal pain and tenderness in the setting of chronic abdominal pain syndromes that are recurrent.  Vital signs are unremarkable.  Abdomen is nonsurgical but not benign on exam either.  Labs and CT scan obtained which are unremarkable.  White blood cell count normal.  Afebrile.  Urinalysis is unremarkable except for moderate ketones consistent with starvation state.  CT still pending, plan to treat with further antiacids since gastritis has been her dominant diagnosis so far.  Clinical Course as of Jun 04 1507  Mon Jun 03, 2018  1232 CT shows enteritis and one dilated bowel loop, suggestive of reactive ileus.  Will reassess patient, and unless she feels much better, she may need hospitalization until symptoms improve for symptom control and hydration given her inability to tolerate oral intake.   [PS]    Clinical Course User Index [PS] Sharman CheekStafford, Everleigh Colclasure, MD    ----------------------------------------- 3:08 PM on 06/03/2018 -----------------------------------------  After Reglan and p.o. trial, patient is able to eat and states that she feels better.  No nausea.  Pain improved.  Patient strongly desires to go home.  I offered admission with the findings of enteritis and possible reactive ileus on CT, the patient would like to try outpatient therapy.  Return precautions discussed.  I think outpatient  therapy is reasonable given her overall reassuring data including vital signs and labs.   ____________________________________________   FINAL CLINICAL IMPRESSION(S) / ED DIAGNOSES    Final diagnoses:  Generalized abdominal pain  Enteritis     ED Discharge Orders         Ordered    metoCLOPramide (REGLAN) 10 MG tablet  Every 6 hours PRN     06/03/18 1505    famotidine (PEPCID) 20 MG tablet  2 times daily     06/03/18 1505    metroNIDAZOLE (FLAGYL) 500 MG tablet  3 times daily     06/03/18 1505    ciprofloxacin (CIPRO) 500 MG tablet  2 times daily     06/03/18 1505          Portions of this note were generated with dragon dictation software. Dictation errors may occur despite best attempts at proofreading.    Sharman CheekStafford, Shanitha Twining, MD 06/03/18 [phone]

## 2018-06-03 NOTE — ED Notes (Signed)
AAOx3.  Skin warm and dry. C/O diarrhea.

## 2018-06-03 NOTE — ED Notes (Signed)
AAOx3.  Skin warm and dry. NAd 

## 2018-06-03 NOTE — ED Notes (Signed)
Tolerated applesauce, crackers, and peanut butter.  Denies Nausea.  NAD

## 2018-06-03 NOTE — ED Triage Notes (Signed)
Pt reports rt upper quad abd pain that radiates down into lower abd since Friday with nausea and vomiting.

## 2018-06-03 NOTE — ED Notes (Signed)
Return from CT.  AAOx3.  Skin warm and dry. NAD 

## 2018-06-03 NOTE — ED Notes (Signed)
Patient transported to CT 

## 2018-06-03 NOTE — ED Notes (Signed)
4 oz ginger ale given to patient. 

## 2018-10-22 ENCOUNTER — Telehealth: Payer: Self-pay | Admitting: Licensed Clinical Social Worker

## 2018-10-22 NOTE — Telephone Encounter (Signed)
If PCP still wants [place] to see the patient I will see her

## 2018-10-22 NOTE — Telephone Encounter (Signed)
Ok I left the front office representative a message at [employer]

## 2018-10-22 NOTE — Telephone Encounter (Signed)
Noted  

## 2018-10-22 NOTE — Telephone Encounter (Signed)
Patient was referred to our office due to gastrointestinal  concerns from her pcp a few weeks ago. Dr. Rivka Safer reviewed her records from [facility], records from [facility] and [place] and determined that the patient did not need to be seen in our office because she does not have an active infection. Numerous test and blood work had been done by several doctors and there was nothing else  that Dr. Rivka Safer would have recommended for the patient. Patient was told this numerous times and still continues to call.  Her primary care office called  thinking that we haven't contacted the patient. I explained that we have communicated this information to the patient several times that she does not need to be seen at this time.

## 2019-04-15 ENCOUNTER — Ambulatory Visit: Payer: BLUE CROSS/BLUE SHIELD | Admitting: Urology

## 2019-04-15 ENCOUNTER — Other Ambulatory Visit: Payer: Self-pay

## 2019-04-15 ENCOUNTER — Encounter: Payer: Self-pay | Admitting: Urology

## 2019-04-15 VITALS — BP 131/79 | HR 67 | Ht 60.0 in | Wt 155.0 lb

## 2019-04-15 DIAGNOSIS — N3010 Interstitial cystitis (chronic) without hematuria: Secondary | ICD-10-CM

## 2019-04-15 DIAGNOSIS — G8929 Other chronic pain: Secondary | ICD-10-CM

## 2019-04-15 DIAGNOSIS — R350 Frequency of micturition: Secondary | ICD-10-CM

## 2019-04-15 DIAGNOSIS — N8111 Cystocele, midline: Secondary | ICD-10-CM

## 2019-04-15 DIAGNOSIS — R102 Pelvic and perineal pain: Secondary | ICD-10-CM

## 2019-04-15 DIAGNOSIS — N952 Postmenopausal atrophic vaginitis: Secondary | ICD-10-CM | POA: Diagnosis not present

## 2019-04-15 DIAGNOSIS — N390 Urinary tract infection, site not specified: Secondary | ICD-10-CM

## 2019-04-15 LAB — URINALYSIS, COMPLETE
Bilirubin, UA: NEGATIVE
Glucose, UA: NEGATIVE
Leukocytes,UA: NEGATIVE
Nitrite, UA: NEGATIVE
Protein,UA: NEGATIVE
Specific Gravity, UA: 1.025 (ref 1.005–1.030)
Urobilinogen, Ur: 0.2 mg/dL (ref 0.2–1.0)
pH, UA: 6 (ref 5.0–7.5)

## 2019-04-15 LAB — MICROSCOPIC EXAMINATION: RBC, Urine: NONE SEEN /HPF (ref 0–2)

## 2019-04-15 MED ORDER — PREMARIN 0.625 MG/GM VA CREA: TOPICAL_CREAM | VAGINAL | 12 refills | Status: DC

## 2019-04-15 NOTE — Progress Notes (Signed)
04/15/2019 11:19 AM   Taylor Navarro 21-Feb-1961 [id number]  Referring provider: Danelle Berry, NP [address] [place],  [place] [address]  Chief Complaint  Patient presents with  . Urinary Frequency    HPI: 58 year old female referred for further evaluation of pelvic pain/recurrent urinary tract infections.  She was previously followed by Dr. Yves Dill and is seeking a second opinion.  She has a very long GU history.  She started seeing Dr. Quillian Quince was diagnosed with IC.    In the past, she underwent hydrodistention's and was treated with DMSO instillations.  She reports that she received DMSO installations while pregnant which ultimately required a D&C.  She is also aware that Dr. Quillian Quince stretched out her bladder because some sort of injury to her bladder.  She then transitioned her care to Dr. Yves Dill who is been following her for 20+ years.  She has documented history of enterococcus faecalis UTI in 12/2018.  She was prescribed macrobid for 7 days.  She reports that her infection did not clear as Macrobid does not work for her.  She needed a second round of Macrobid.  She reports that she only responds to amoxicillin which she can tolerate quite well and its only that has ever worked for her in the past despite cultures suggesting that it is resistant to this.  She also had a pelvic exam by Dr. Yves Dill and was found to have a first-degree cystocele along with atrophic vaginitis.  She is also seen and evaluated by Dr. Amalia Hailey in 03/2018 found to have a stage II cystocele.  She was offered estrogen cream but declined.  He was treated for bacterial vaginosis.    On several notes, she is also reportedly has a history of "kinking of her ureter".  This is not appreciated on most recent abdominal pelvic CT scan from 06/2018 with normal GU anatomy without hydronephrosis.  She she has numerous complaints today.  She describes a long history of intestinal infections that she got from [place] about 2 years  ago resulting in copious diarrhea that "none of the doctors could figure out".  Most recently, she underwent pan endoscopy.  She continues to have chronic diarrhea.  She denies constipation.  From a urological perspective, her primary complaints today are chronic burning/tingling in her bladder area.  When her bladder is full it radiates to her right lower quadrant.  It improves of bladder emptying.  This is chronic for her and is been going on for 10+ years.  She also complains today about bulging in her vagina.  She is a known documented cystocele as indicated above.  She reports that when she was on Macrobid, she did not feel the bulge is much and she feels like she is "retaining fluid or some sort of inflammation" in her vaginal area when she is not on antibiotics.  She also complains today about urinary urgency and frequency.  She gets about 4 times at night to void which is bothersome to her.  She tries to quit drinking at least 2 hours before bedtime.  She also has daytime frequency.  She denies any significant incontinence.  She has tried pelvic floor therapy in the past which has not been helpful.  She is not sexually active.  She has no sex drive.  She is postmenopausal.  PMH: Past Medical History:  Diagnosis Date  . Bacterial infection due to H. pylori   . Gastritis     Surgical History: Past Surgical History:  Procedure  Laterality Date  . KNEE SURGERY    . ovarian  2004   scar tissue removal  . TUBAL LIGATION  1999    Home Medications:  Allergies as of 04/15/2019      Reactions   Bupivacaine Nausea Only   REACTION: flushing, sweating, nausea REACTION: flushing, sweating, nausea   Cefpodoxime Other (See Comments)   Note: Causes Collitis   Cephalexin Other (See Comments)   REACTION: GI REACTION: GI   Sulfa Antibiotics Nausea And Vomiting, Swelling   REACTION: swelling   Sulfasalazine Nausea And Vomiting, Swelling, Other (See Comments)   REACTION: swelling    Sulfonamide Derivatives    REACTION: swelling      Medication List       Accurate as of April 15, 2019 11:59 PM. If you have any questions, ask your nurse or doctor.        STOP taking these medications   ciprofloxacin 500 MG tablet Commonly known as: Cipro Stopped by: Vanna ScotlandAshley Shravya Wickwire, MD   clotrimazole-betamethasone cream Commonly known as: Lotrisone Stopped by: Vanna ScotlandAshley Pantelis Elgersma, MD   famotidine 20 MG tablet Commonly known as: PEPCID Stopped by: Vanna ScotlandAshley Marcene Laskowski, MD   lansoprazole 15 MG capsule Commonly known as: PREVACID Stopped by: Vanna ScotlandAshley Uva Runkel, MD   lidocaine-prilocaine cream Commonly known as: EMLA Stopped by: Vanna ScotlandAshley Brenn Deziel, MD   metoCLOPramide 10 MG tablet Commonly known as: REGLAN Stopped by: Vanna ScotlandAshley Jozef Eisenbeis, MD   metroNIDAZOLE 0.75 % vaginal gel Commonly known as: METROGEL Stopped by: Vanna ScotlandAshley Burris Matherne, MD   metroNIDAZOLE 500 MG tablet Commonly known as: Flagyl Stopped by: Vanna ScotlandAshley Varun Jourdan, MD     TAKE these medications   Premarin vaginal cream Generic drug: conjugated estrogens Apply one pea sized amount MWF at night Started by: Vanna ScotlandAshley Tyshun Tuckerman, MD       Allergies:  Allergies  Allergen Reactions  . Bupivacaine Nausea Only    REACTION: flushing, sweating, nausea REACTION: flushing, sweating, nausea  . Cefpodoxime Other (See Comments)    Note: Causes Collitis  . Cephalexin Other (See Comments)    REACTION: GI REACTION: GI  . Sulfa Antibiotics Nausea And Vomiting and Swelling    REACTION: swelling  . Sulfasalazine Nausea And Vomiting, Swelling and Other (See Comments)    REACTION: swelling  . Sulfonamide Derivatives     REACTION: swelling    Family History: Family History  Problem Relation Age of Onset  . Lung cancer Mother   . Cancer Mother   . Leukemia Father   . Cancer Father     Social History:  reports that she has never smoked. She has never used smokeless tobacco. She reports current alcohol use. She reports that she does not use  drugs.  ROS: UROLOGY Frequent Urination?: Yes Hard to postpone urination?: No Burning/pain with urination?: Yes Get up at night to urinate?: Yes Leakage of urine?: Yes Urine stream starts and stops?: No Trouble starting stream?: No Do you have to strain to urinate?: Yes Blood in urine?: No Urinary tract infection?: Yes Sexually transmitted disease?: No Injury to kidneys or bladder?: No Painful intercourse?: No Weak stream?: No Currently pregnant?: No Vaginal bleeding?: No Last menstrual period?: n  Gastrointestinal Nausea?: No Vomiting?: No Indigestion/heartburn?: No Diarrhea?: No Constipation?: No  Constitutional Fever: No Night sweats?: No Weight loss?: No Fatigue?: No  Skin Skin rash/lesions?: No Itching?: No  Eyes Double vision?: No  Ears/Nose/Throat Sore throat?: No Sinus problems?: No  Hematologic/Lymphatic Swollen glands?: No Easy bruising?: No  Cardiovascular Leg swelling?: No Chest pain?: No  Respiratory Cough?: No Shortness of breath?: No  Endocrine Excessive thirst?: No  Musculoskeletal Back pain?: No Joint pain?: No  Neurological Headaches?: No Dizziness?: No  Psychologic Depression?: No Anxiety?: No  Physical Exam: BP 131/79   Pulse 67   Ht 5' (1.524 m)   Wt 155 lb (70.3 kg)   BMI 30.27 kg/m   Constitutional:  Alert and oriented, No acute distress. HEENT: [place] AT, moist mucus membranes.  Trachea midline, no masses. Cardiovascular: No clubbing, cyanosis, or edema. Respiratory: Normal respiratory effort, no increased work of breathing. Skin: No rashes, bruises or suspicious lesions. Neurologic: Grossly intact, no focal deficits, moving all 4 extremities. Psychiatric: Normal mood and affect.  Laboratory Data: Lab Results  Component Value Date   WBC 9.8 06/03/2018   HGB 15.5 (H) 06/03/2018   HCT 46.8 (H) 06/03/2018   MCV 87.6 06/03/2018   PLT 315 06/03/2018    Lab Results  Component Value Date   CREATININE 0.80  06/03/2018     Urinalysis Results for orders placed or performed in visit on 04/15/19  Microscopic Examination   URINE  Result Value Ref Range   WBC, UA 0-5 0 - 5 /hpf   RBC None seen 0 - 2 /hpf   Epithelial Cells (non renal) 0-10 0 - 10 /hpf   Mucus, UA Present (A) Not Estab.   Bacteria, UA Few None seen/Few  Urinalysis, Complete  Result Value Ref Range   Specific Gravity, UA 1.025 1.005 - 1.030   pH, UA 6.0 5.0 - 7.5   Color, UA Yellow Yellow   Appearance Ur Hazy (A) Clear   Leukocytes,UA Negative Negative   Protein,UA Negative Negative/Trace   Glucose, UA Negative Negative   Ketones, UA Trace (A) Negative   RBC, UA Trace (A) Negative   Bilirubin, UA Negative Negative   Urobilinogen, Ur 0.2 0.2 - 1.0 mg/dL   Nitrite, UA Negative Negative   Microscopic Examination See below:     Pertinent Imaging: CT scan from 06/2018 reviewed  Assessment & Plan:    1. Interstitial cystitis Personal history of previously diagnosed IC which may or may not be part of her presentation today. - Urinalysis, Complete  2. Atrophic vaginitis As per previous recommendations by 2 other physicians, the patient would likely benefit from topical estrogen cream both for recurrent urinary tract infection history as well as irritative voiding symptoms which may be related to her vaginal atrophy.  We discussed the actual risk of topical estrogen cream with very low systemic absorption.  She will be using pea-sized amount 3 times per week after daily for 2 weeks.  Risks of medication were also discussed.  She was given samples of this and script sent to pharmacy.  3. Recurrent UTI No evidence of UTI today.  Insistent that she improves with amoxicillin which she would like noted in her chart today--> advised that she will be treated as deemed appropriate based on culture and sensitivity data.  We also discussed today that we are available to see her on an as-needed basis with UTI type symptoms   She already takes a probiotic.  Discussed addition of cranberry tablets which could be helpful.  4. Cystocele, midline We discussed management of symptomatic cystocele.  She may benefit from estrogen cream as above.  We also discussed operative repair versus pessary which she declined.  5. Chronic pelvic pain in female Patient may benefit from repeat pelvic therapy in the future.  Right lower quadrant pain just prior to him with  voiding unclear, no evidence of GU pathology on CT scan.   ?  Pelvic floor dysfunction versus adhesions  6. Urinary frequency We discussed behavioral modification.  She reports she is never tried any medication including anticholinergics or beta 3 agonist for her frequency and urgency symptoms.  She was given samples of Myrbetriq 25 mg today x4 weeks.  She will call and let us know if this is effective.  Possible side effects including increased blood pressure were discussed.   Return in about 3 months (around 07/16/2019) for MD follow up.  Vanna Scotland, MD  [facility] [address], [address] [place], [place] [address] [phone]  I spent 60 min with this patient of which greater than 50% was spent in counseling and coordination of care with the patient.

## 2019-04-17 ENCOUNTER — Telehealth: Payer: Self-pay | Admitting: Urology

## 2019-04-17 DIAGNOSIS — N390 Urinary tract infection, site not specified: Secondary | ICD-10-CM

## 2019-04-17 NOTE — Telephone Encounter (Addendum)
Patient has been having pressure and tingling in her pelvis. She has been using premarin as directed-Myrbretriq made her feel "funnny" she discontinued use. Denies urinary issues or symptoms.

## 2019-04-17 NOTE — Telephone Encounter (Signed)
Pt is having a lot of tingling and numbness and she has started trying the estrogen and wants to know if this could possibly be a side effect of this. She also states she is having a lot of pressure. She would like a call back after 3pm, due to being at work.

## 2019-04-18 NOTE — Telephone Encounter (Signed)
Strongly encourage that she keep uing both of the medications, hasn't really given either enough time to find out if effective.    At minimum, keep suing estrogen cream as discussed in the office because it take time for the steroid hormone to take effect.   FYI- she was complaining of both tingling and pressure PRIOR to these medications.      Hollice Espy, MD

## 2019-04-22 NOTE — Telephone Encounter (Signed)
Informed patient-she does not want to continue to take Myrbetriq. Stated she was still having frequency, burning and tingling. Scheduled an appointment to be re-evaluated with Zara Council, PA.

## 2019-04-22 NOTE — Telephone Encounter (Signed)
Pt called and states that she is still having tingling constantly and pressure with or without urination. She requests a call back.

## 2019-04-23 ENCOUNTER — Ambulatory Visit (INDEPENDENT_AMBULATORY_CARE_PROVIDER_SITE_OTHER): Payer: Managed Care, Other (non HMO) | Admitting: Urology

## 2019-04-23 ENCOUNTER — Other Ambulatory Visit: Payer: Self-pay

## 2019-04-23 ENCOUNTER — Telehealth: Payer: Self-pay | Admitting: Urology

## 2019-04-23 ENCOUNTER — Encounter: Payer: Self-pay | Admitting: Urology

## 2019-04-23 VITALS — BP 132/70 | HR 82 | Ht 60.0 in | Wt 155.0 lb

## 2019-04-23 DIAGNOSIS — R102 Pelvic and perineal pain unspecified side: Secondary | ICD-10-CM

## 2019-04-23 DIAGNOSIS — N3010 Interstitial cystitis (chronic) without hematuria: Secondary | ICD-10-CM

## 2019-04-23 DIAGNOSIS — N3281 Overactive bladder: Secondary | ICD-10-CM

## 2019-04-23 DIAGNOSIS — N8111 Cystocele, midline: Secondary | ICD-10-CM | POA: Diagnosis not present

## 2019-04-23 DIAGNOSIS — N390 Urinary tract infection, site not specified: Secondary | ICD-10-CM

## 2019-04-23 LAB — BLADDER SCAN AMB NON-IMAGING

## 2019-04-23 MED ORDER — FESOTERODINE FUMARATE ER 4 MG PO TB24: 4.0000 mg | ORAL_TABLET | Freq: Every day | ORAL | 0 refills | Status: DC

## 2019-04-23 NOTE — Telephone Encounter (Signed)
Pt called and states that her insurance does not cover [place] [facility], it only covers [facility].

## 2019-04-23 NOTE — Progress Notes (Signed)
04/23/2019 9:42 AM   Taylor Navarro 03/18/1961 [id number]  Referring provider: Fayrene HelperBoswell, Chelsa H, NP [address] [place],  [place] [id number]  Chief Complaint  Patient presents with  . Recurrent UTI    HPI: [name] is a 58 year old female who presents today to be screened for a possible urinary tract infection and for an adverse drug reaction to Myrbetriq.  She was seen by Dr. Apolinar JunesBrandon on April 15, 2019 for a second opinion regarding her pelvic pain/recurrent urinary tract infections.  A detail history regarding her long standing GU symptoms was obtained during that visit.  Please refer to Dr. Delana MeyerBrandon's notes from 04/15/2019 for specifics.  It was recommended at that visit that she initiate vaginal estrogen cream, start cranberry tablets and she was given samples of Myrbetriq 25 mg.  Her UA was also checked for infection and it was benign.    She has started applying the vaginal estrogen cream and will do it nightly for 1 week and then transition to applying it on Monday nights Wednesday nights and Friday nights.  She took 1 tablet of the Myrbetriq and the next morning she states both her hands were swollen.   She stated she will not take any more of that medication.    Today, she is experiencing frequency, urgency, painful urination, intermittency and a weak urinary stream.  She is having episodes where she gets hot internally and it associated with nausea and pelvic pain and the urge to urinate.  She states the symptoms abate after voiding.  These symptoms have been occurring for the last three months.  She does not admit to any aggravating or modifying factors.  Her UA is benign and her PVR is 0 mL.    [facility]     PMH: Past Medical History:  Diagnosis Date  . Bacterial infection due to H. pylori   . Gastritis     Surgical History: Past Surgical History:  Procedure Laterality Date  . KNEE SURGERY    . ovarian  2004   scar tissue removal  . TUBAL LIGATION  1999    Home Medications:  Allergies as of 04/23/2019      Reactions   Bupivacaine Nausea Only   REACTION: flushing, sweating, nausea REACTION: flushing, sweating, nausea   Cefpodoxime Other (See Comments)   Note: Causes Collitis   Cephalexin Other (See Comments)   REACTION: GI REACTION: GI   Sulfa Antibiotics Nausea And Vomiting, Swelling   REACTION: swelling   Sulfasalazine Nausea And Vomiting, Swelling, Other (See Comments)   REACTION: swelling   Sulfonamide Derivatives    REACTION: swelling   Augmentin [amoxicillin-pot Clavulanate] Nausea And Vomiting      Medication List       Accurate as of April 23, 2019  9:42 AM. If you have any questions, ask your nurse or doctor.        Premarin vaginal cream Generic drug: conjugated estrogens Apply one pea sized amount MWF at night       Allergies:  Allergies  Allergen Reactions  . Bupivacaine Nausea Only    REACTION: flushing, sweating, nausea REACTION: flushing, sweating, nausea  . Cefpodoxime Other (See Comments)    Note: Causes Collitis  . Cephalexin Other (See Comments)    REACTION: GI REACTION: GI  . Sulfa Antibiotics Nausea And Vomiting and Swelling    REACTION: swelling  . Sulfasalazine Nausea And Vomiting, Swelling and Other (See Comments)  REACTION: swelling  . Sulfonamide Derivatives     REACTION: swelling  . Augmentin [Amoxicillin-Pot Clavulanate] Nausea And Vomiting    Family History: Family History  Problem Relation Age of Onset  . Lung cancer Mother   . Cancer Mother   . Leukemia Father   . Cancer Father     Social History:  reports that she has never smoked. She has never used smokeless tobacco. She reports current alcohol use. She reports that she does not use drugs.  ROS: UROLOGY Frequent Urination?: Yes Hard to postpone urination?: Yes Burning/pain with urination?: Yes Get up at night to urinate?: Yes Leakage of urine?: No Urine stream  starts and stops?: Yes Trouble starting stream?: No Do you have to strain to urinate?: No Blood in urine?: No Urinary tract infection?: No Sexually transmitted disease?: No Injury to kidneys or bladder?: No Painful intercourse?: No Weak stream?: No Currently pregnant?: No Vaginal bleeding?: No Last menstrual period?: n  Gastrointestinal Nausea?: Yes Vomiting?: No Indigestion/heartburn?: No Diarrhea?: Yes Constipation?: No  Constitutional Fever: No Night sweats?: Yes Weight loss?: No Fatigue?: Yes  Skin Skin rash/lesions?: No Itching?: Yes  Eyes Blurred vision?: No Double vision?: No  Ears/Nose/Throat Sore throat?: No Sinus problems?: No  Hematologic/Lymphatic Swollen glands?: No Easy bruising?: No  Cardiovascular Leg swelling?: No Chest pain?: No  Respiratory Cough?: No Shortness of breath?: No  Endocrine Excessive thirst?: No  Musculoskeletal Back pain?: No Joint pain?: Yes  Neurological Headaches?: Yes Dizziness?: No  Psychologic Depression?: No Anxiety?: No  Physical Exam: BP 132/70   Pulse 82   Ht 5' (1.524 m)   Wt 155 lb (70.3 kg)   BMI 30.27 kg/m   Constitutional:  Well nourished. Alert and oriented, No acute distress. HEENT: [place] AT, moist mucus membranes.  Trachea midline, no masses. Cardiovascular: No clubbing, cyanosis, or edema. Respiratory: Normal respiratory effort, no increased work of breathing. Neurologic: Grossly intact, no focal deficits, moving all 4 extremities. Psychiatric: Normal mood and affect.  Laboratory Data: Lab Results  Component Value Date   WBC 9.8 06/03/2018   HGB 15.5 (H) 06/03/2018   HCT 46.8 (H) 06/03/2018   MCV 87.6 06/03/2018   PLT 315 06/03/2018    Lab Results  Component Value Date   CREATININE 0.80 06/03/2018    No results found for: PSA  No results found for: TESTOSTERONE  No results found for: HGBA1C  Lab Results  Component Value Date   TSH 3.10 04/16/2007    No results  found for: CHOL, HDL, CHOLHDL, VLDL, LDLCALC  Lab Results  Component Value Date   AST 23 06/03/2018   Lab Results  Component Value Date   ALT 13 06/03/2018   No components found for: ALKALINEPHOPHATASE No components found for: BILIRUBINTOTAL  No results found for: ESTRADIOL  Urinalysis Component     Latest Ref Rng & Units 04/23/2019  Specific Gravity, UA     1.005 - 1.030 >1.030 (H)  pH, UA     5.0 - 7.5 6.0  Color, UA     Yellow Yellow  Appearance Ur     Clear Hazy (A)  Leukocytes,UA     Negative Negative  Protein,UA     Negative/Trace Negative  Glucose, UA     Negative Negative  Ketones, UA     Negative Negative  RBC, UA     Negative Negative  Bilirubin, UA     Negative Negative  Urobilinogen, Ur     0.2 - 1.0 mg/dL 0.2  Nitrite,  UA     Negative Negative  Microscopic Examination      See below:   Component     Latest Ref Rng & Units 04/23/2019  WBC, UA     0 - 5 /hpf None seen  RBC     0 - 2 /hpf None seen  Epithelial Cells (non renal)     0 - 10 /hpf 0-10  Mucus, UA     Not Estab. Present (A)  Bacteria, UA     None seen/Few None seen   I have reviewed the labs.   Pertinent Imaging: Results for CHAYIL, GANTT (MRN [id number]) as of 04/23/2019 10:43  Ref. Range 04/23/2019 09:37  Scan Result Unknown 70mL     Assessment & Plan:    1. Chronic pelvic pain Have placed a referral for her to [facility] urogynecology In the interim I have encouraged her to undergo pelvic floor physical therapy which she is agreeable-referral was placed  2.  Atrophic vaginitis She will continue the vaginal estrogen cream 3 nights weekly I did reiterate that it can take up to 8 weeks to see the physiological changes in the vaginal mucosa  3. OAB Explained to the patient that OAB medications need to be taken for 3 months in order to determine if she will reach goal with the medication  Will have a trial of Toviaz 4 mg daily, # 28 samples given Referral to [facility]  Urogynecology and PT in place  4. Cystocele Continue the vaginal estrogen cream, referral to urogynecology and PT in place   5. rUTI UA is benign No evidence of UTI at today's appointment as she has been having her urinary symptoms for the last 3 months and have not been associated with gross hematuria, fever or chills  6. IC See above   Return for patient being referred to [facility] .  These notes generated with voice recognition software. I apologize for typographical errors.   Addendum:  Patient called back and stated that [facility] is not in her network and would like a referral to [facility] U[facility].  Referral placed.  Michiel Cowboy, PA-C  [facility] [address]  [address] [place], [place] [address] [phone]

## 2019-04-24 LAB — URINALYSIS, COMPLETE
Bilirubin, UA: NEGATIVE
Glucose, UA: NEGATIVE
Ketones, UA: NEGATIVE
Leukocytes,UA: NEGATIVE
Nitrite, UA: NEGATIVE
Protein,UA: NEGATIVE
RBC, UA: NEGATIVE
Specific Gravity, UA: >1.03 — ABNORMAL HIGH (ref 1.005–1.030)
Urobilinogen, Ur: 0.2 mg/dL (ref 0.2–1.0)
pH, UA: 6 (ref 5.0–7.5)

## 2019-04-24 LAB — MICROSCOPIC EXAMINATION
Bacteria, UA: NONE SEEN
RBC: NONE SEEN /hpf (ref 0–2)
WBC, UA: NONE SEEN /hpf (ref 0–5)

## 2019-06-16 IMAGING — MR MR SHOULDER*L* W/O CM
4 of 5 series · 30 of 40 positions shown · non-contrast
Comparison: None.

CLINICAL DATA: Injured years ago. No new injury. Shoulder pain
radiates down the left arm. No numbness or tingling.

EXAM:
MRI OF THE LEFT SHOULDER WITHOUT CONTRAST
TECHNIQUE: Multiplanar, multisequence MR imaging of the shoulder was performed.
No intravenous contrast was administered.

[Series 5: PD fat-sat · axial · left · 4.0mm · 0.59mm/px · z∈[-43,+87]mm · 8 of 28 slices shown (1 of 2)]
[im 1/28]
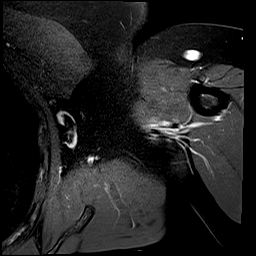
[im 4/28]
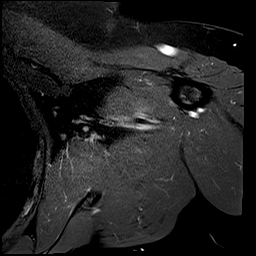
[im 10/28]
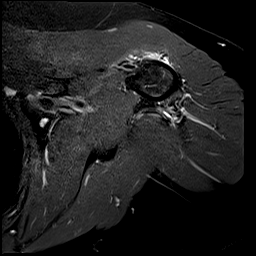
[im 13/28]
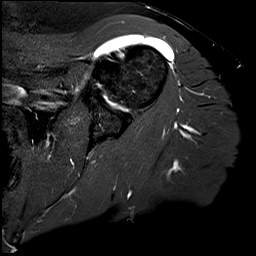
[im 16/28]
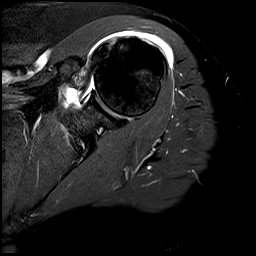
[im 19/28]
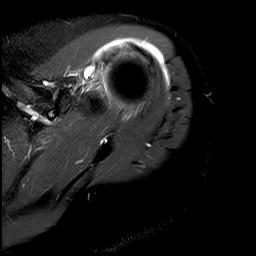
[im 25/28]
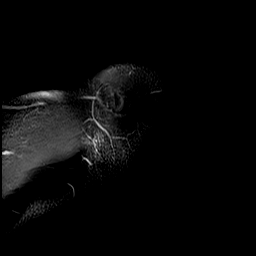
[im 28/28]
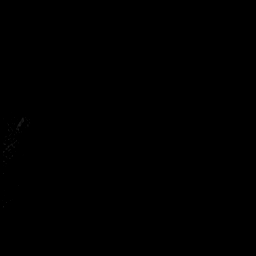

[Series 7: PD fat-sat · oblique · left · 4.0mm · 0.44mm/px · 7 of 20 slices shown (2 of 2)]
[im 1/20]
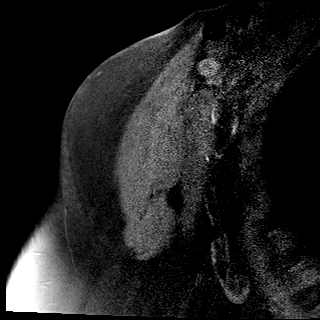
[im 4/20]
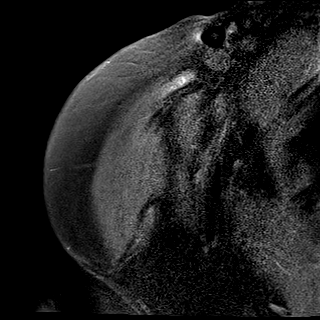
[im 7/20]
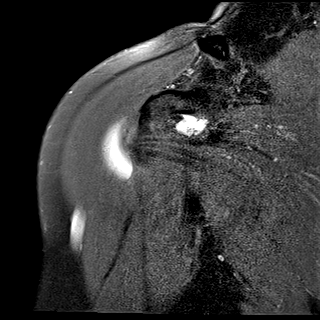
[im 10/20]
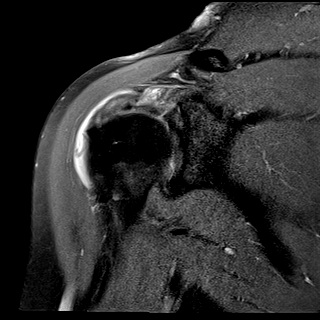
[im 13/20]
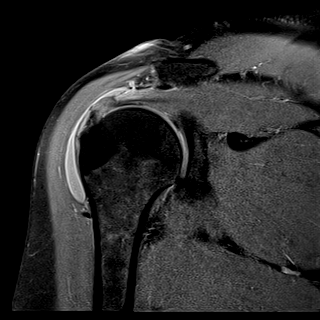
[im 16/20]
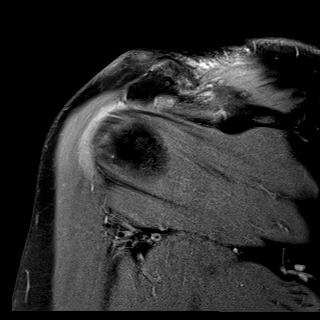
[im 20/20]
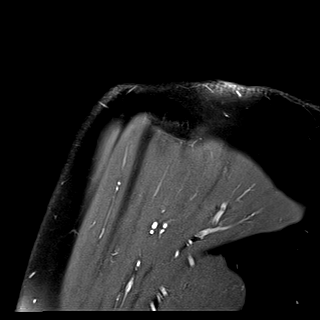

[Series 8: T2 fat-sat · oblique · left · 4.0mm · 0.44mm/px · 7 of 20 slices shown (1 of 2)]
[im 1/20]
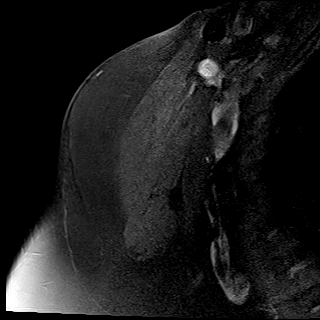
[im 4/20]
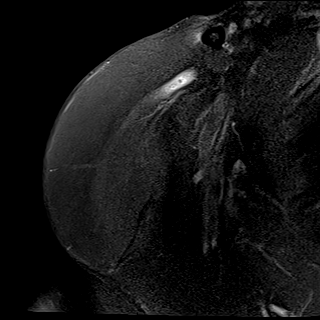
[im 7/20]
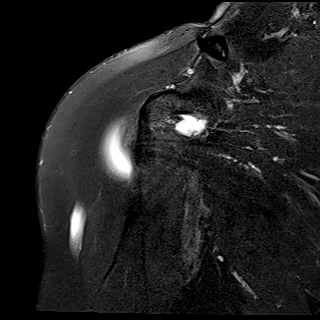
[im 10/20]
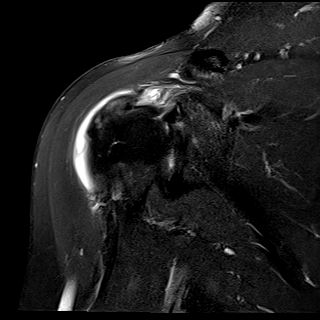
[im 13/20]
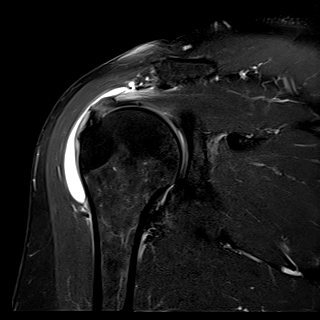
[im 16/20]
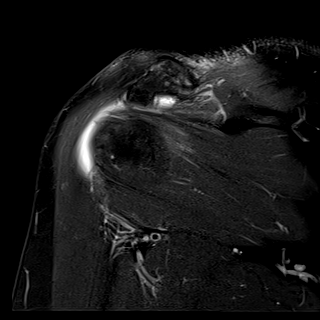
[im 20/20]
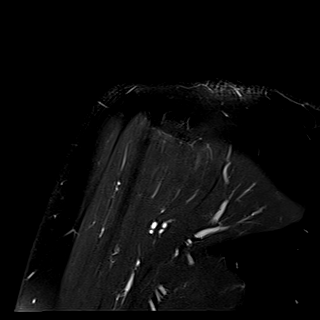

[Series 9: T2 fat-sat · coronal · left · 4.0mm · 0.23mm/px · 8 of 22 slices shown (2 of 2)]
[im 1/22]
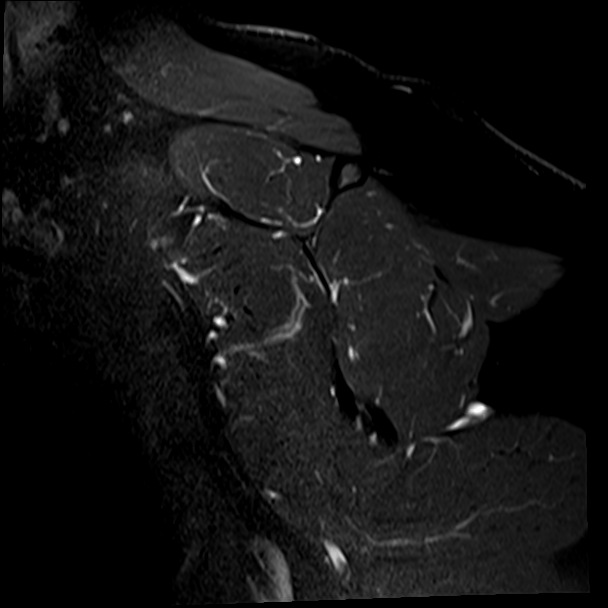
[im 4/22]
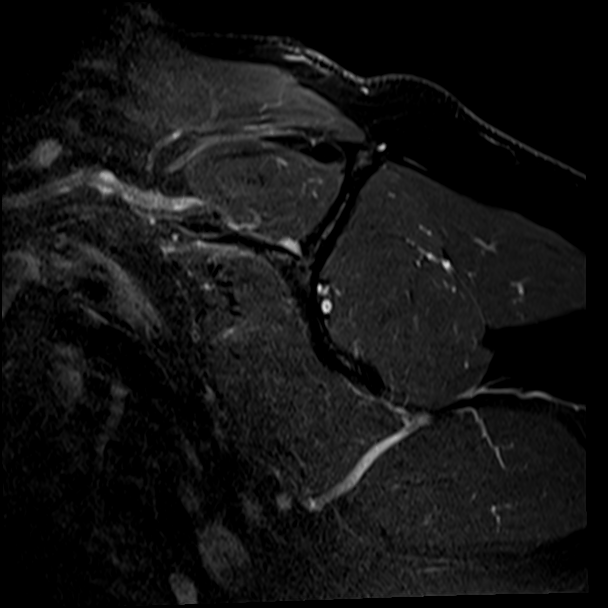
[im 7/22]
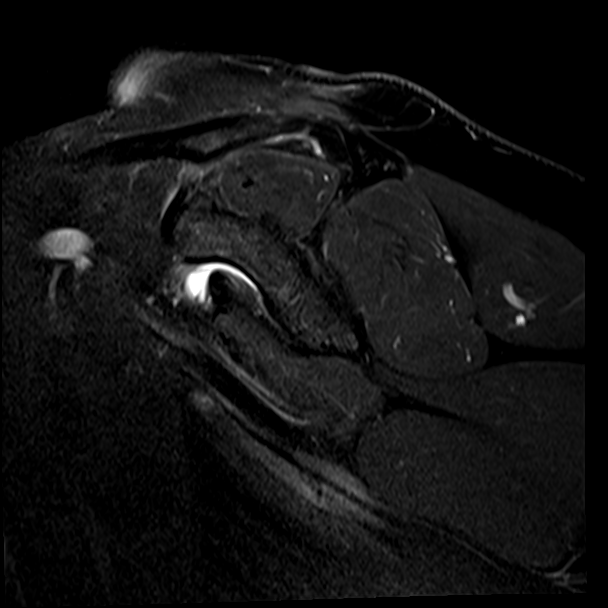
[im 10/22]
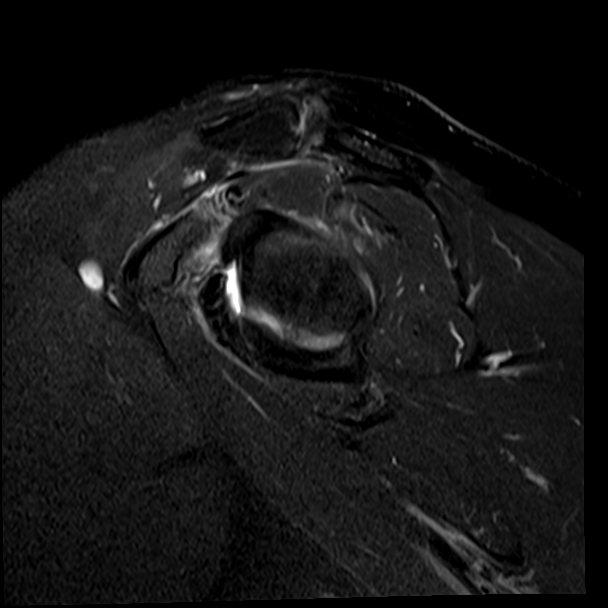
[im 13/22]
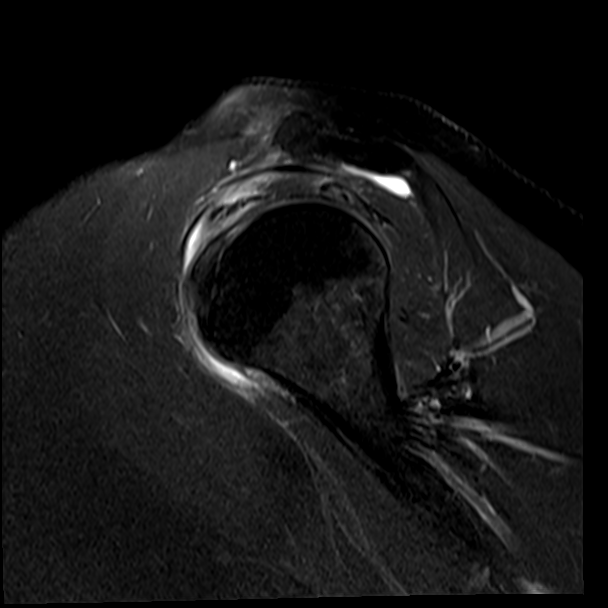
[im 16/22]
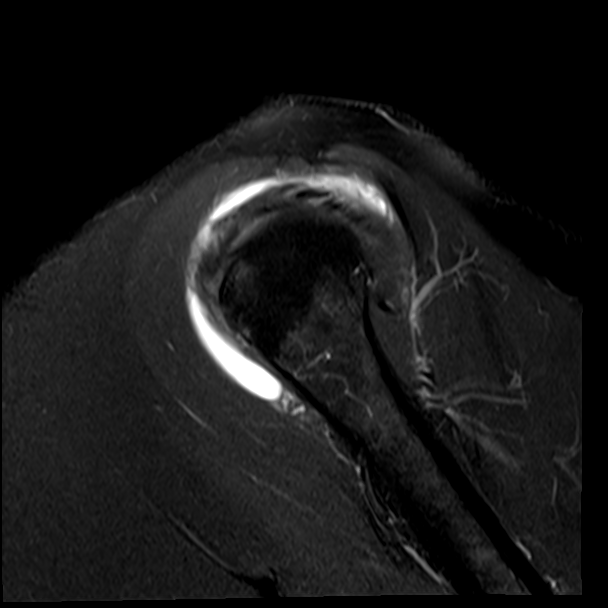
[im 19/22]
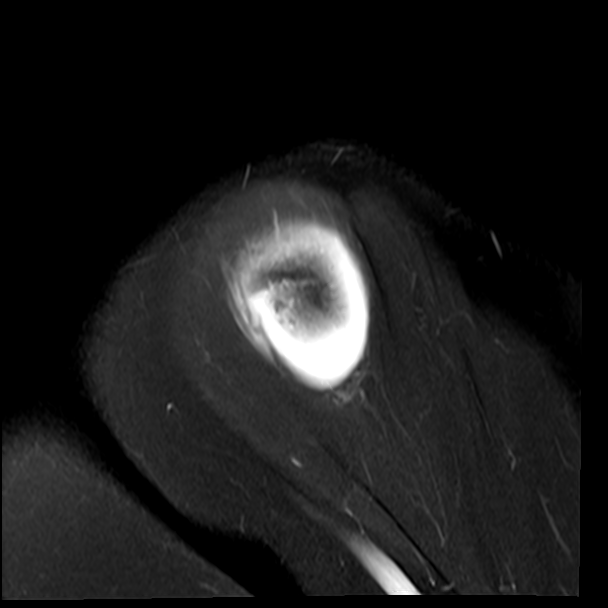
[im 22/22]
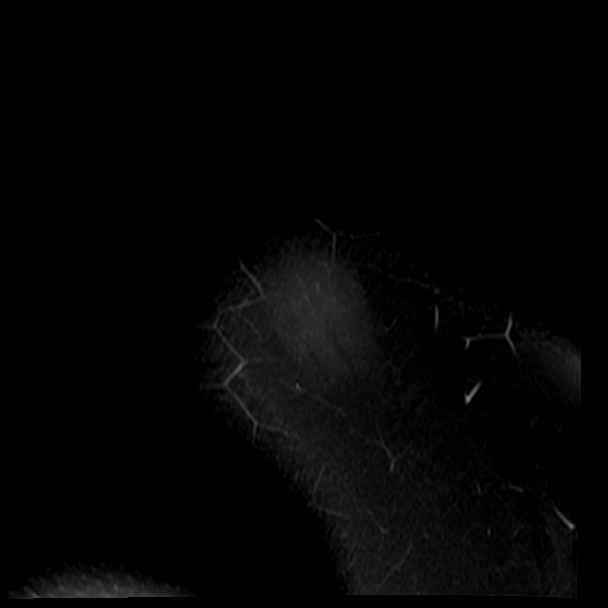

[30 of 40 positions shown; findings below may reference images not displayed]

FINDINGS: Rotator cuff: Severe tendinosis of the supraspinatus and
infraspinatus tendons with an interstitial tear just beyond the
musculotendinous junction with extension to the bursal surface.
Moderate tendinosis of the infraspinatus tendon. Teres minor tendon
is intact. Subscapularis tendon is intact.

Muscles: No atrophy or fatty replacement of nor abnormal signal
within, the muscles of the rotator cuff.

Biceps long head: Mild tendinosis of the intra-articular portion of
the long head of the biceps tendon.

Acromioclavicular Joint: Moderate arthropathy of the
acromioclavicular joint. Type I acromion. Moderate amount of
subacromial/subdeltoid bursal fluid.

Glenohumeral Joint: No joint effusion.  No chondral defect.

Labrum: Grossly intact, but evaluation is limited by lack of
intraarticular fluid.

Bones:  No acute osseous abnormality.  No aggressive osseous lesion.

Other: No other fluid collection or hematoma.
IMPRESSION: 1. Severe tendinosis of the supraspinatus and infraspinatus tendons
with an interstitial tear just beyond the musculotendinous junction
with extension to the bursal surface.
2. Moderate tendinosis of the infraspinatus tendon.
3. Mild tendinosis of the intra-articular portion of the long head
of the biceps tendon.
4. Moderate subacromial/subdeltoid bursitis.

## 2019-07-16 ENCOUNTER — Ambulatory Visit: Payer: Managed Care, Other (non HMO) | Admitting: Urology

## 2020-03-23 ENCOUNTER — Other Ambulatory Visit: Payer: Self-pay | Admitting: Physician Assistant

## 2020-03-23 DIAGNOSIS — K219 Gastro-esophageal reflux disease without esophagitis: Secondary | ICD-10-CM

## 2020-03-23 DIAGNOSIS — R519 Headache, unspecified: Secondary | ICD-10-CM

## 2020-04-05 ENCOUNTER — Ambulatory Visit: Admission: RE | Admit: 2020-04-05 | Payer: BLUE CROSS/BLUE SHIELD | Source: Ambulatory Visit

## 2020-04-09 ENCOUNTER — Other Ambulatory Visit: Payer: Managed Care, Other (non HMO)

## 2021-07-11 ENCOUNTER — Telehealth: Payer: Self-pay

## 2021-07-11 NOTE — Telephone Encounter (Signed)
LM on VM returning her call please call back.

## 2021-07-11 NOTE — Telephone Encounter (Signed)
Pt called upset, reporting  she called here on Thursday and the receptionist hung up on her and never called her back, pt was trying to schedule an appt to be checked for a fungus that's transferred from dogs to humans,  Pt report she called [facility] and made and appt and she will not be calling back here.

## 2021-07-14 ENCOUNTER — Telehealth: Payer: Self-pay

## 2021-07-14 NOTE — Telephone Encounter (Signed)
I have called this pt multiple times and left messages for her to call back to schedule an appt.

## 2021-08-10 ENCOUNTER — Telehealth: Payer: Self-pay | Admitting: *Deleted

## 2021-08-10 NOTE — Telephone Encounter (Signed)
"  I was a patient with your facility.  You do not take my insurance anymore.  Please fax my medical records.  I'm seeing a doctor next week.  It's [facility].  The fax number is [phone].  You do not need to call me back."

## 2021-08-26 NOTE — Telephone Encounter (Signed)
I called and informed Taylor Navarro that her medical records were ready.  She said she would pick them up on Monday.

## 2021-08-30 ENCOUNTER — Telehealth: Payer: Self-pay | Admitting: *Deleted

## 2021-08-30 NOTE — Telephone Encounter (Signed)
"  I'm returning the call for Taylor Navarro, I believe.  I was there this afternoon at 12 but you were already gone.  If it's possible at all can you just mail me my records so I don't have to keep coming back and forth?  My address is [address] in [place], [place] [address]."   I left [name] a message that she has to come by the office to pick up the records.  We cannot mail them.  I informed her that there is a form she must sign and there's a $5 charge for any xray copies.  I also informed her our our office hours.  I asked her to call if she has further questions.

## 2022-07-05 ENCOUNTER — Telehealth: Payer: Self-pay | Admitting: Internal Medicine

## 2022-07-05 NOTE — Telephone Encounter (Signed)
Left vm to confirm 07/10/2022 appointment-Toni 

## 2022-07-10 ENCOUNTER — Ambulatory Visit (INDEPENDENT_AMBULATORY_CARE_PROVIDER_SITE_OTHER): Payer: BLUE CROSS/BLUE SHIELD | Admitting: Internal Medicine

## 2022-07-10 ENCOUNTER — Encounter: Payer: Self-pay | Admitting: Internal Medicine

## 2022-07-10 VITALS — BP 148/90 | HR 82 | Temp 97.8°F | Resp 16 | Ht 60.0 in | Wt 156.0 lb

## 2022-07-10 DIAGNOSIS — Z1231 Encounter for screening mammogram for malignant neoplasm of breast: Secondary | ICD-10-CM | POA: Diagnosis not present

## 2022-07-10 DIAGNOSIS — J3089 Other allergic rhinitis: Secondary | ICD-10-CM

## 2022-07-10 DIAGNOSIS — R030 Elevated blood-pressure reading, without diagnosis of hypertension: Secondary | ICD-10-CM

## 2022-07-10 DIAGNOSIS — L723 Sebaceous cyst: Secondary | ICD-10-CM | POA: Diagnosis not present

## 2022-07-10 DIAGNOSIS — J0101 Acute recurrent maxillary sinusitis: Secondary | ICD-10-CM | POA: Diagnosis not present

## 2022-07-10 MED ORDER — MONTELUKAST SODIUM 10 MG PO TABS: 10.0000 mg | ORAL_TABLET | Freq: Every day | ORAL | 3 refills | Status: DC

## 2022-07-10 MED ORDER — LEVOFLOXACIN 500 MG PO TABS: 500.0000 mg | ORAL_TABLET | Freq: Every day | ORAL | 0 refills | Status: AC

## 2022-07-10 NOTE — Progress Notes (Unsigned)
[facility] [address] [place], [place] [address]  Internal MEDICINE  Office Visit Note  Patient Name: Taylor Navarro  [id number]  [id number]  Date of Service: 07/10/2022   Complaints/HPI Pt is here for establishment of PCP. Chief Complaint  Patient presents with   New Patient (Initial Visit)    Patient wants to discuss sinus infections, migraines and lump on left side of her neck (was initially a tick bite).   Quality Metric Gaps    Mammogram, Colonoscopy and Papsmear   HPI Pt is here for establishment  Tick bite x 20 years ago and now has a cyst  Sinus headaches, sinus infection, PND Seems like Migraine headaches  Current Medication: Outpatient Encounter Medications as of 07/10/2022  Medication Sig   [DISCONTINUED] conjugated estrogens (PREMARIN) vaginal cream Apply one pea sized amount MWF at night   [DISCONTINUED] fesoterodine (TOVIAZ) 4 MG TB24 tablet Take 1 tablet (4 mg total) by mouth daily.   No facility-administered encounter medications on file as of 07/10/2022.    Surgical History: Past Surgical History:  Procedure Laterality Date   KNEE SURGERY     ovarian  2004   scar tissue removal   TUBAL LIGATION  1999    Medical History: Past Medical History:  Diagnosis Date   Bacterial infection due to H. pylori    Gastritis     Family History: Family History  Problem Relation Age of Onset   Lung cancer Mother    Cancer Mother    Leukemia Father    Cancer Father     Social History   Socioeconomic History   Marital status: Married    Spouse name: Not on file   Number of children: Not on file   Years of education: Not on file   Highest education level: Not on file  Occupational History   Not on file  Tobacco Use   Smoking status: Never   Smokeless tobacco: Never  Substance and Sexual Activity   Alcohol use: Yes   Drug use: No   Sexual activity: Not Currently  Other Topics Concern   Not on file  Social History Narrative   Not on  file   Social Determinants of Health   Financial Resource Strain: Not on file  Food Insecurity: Not on file  Transportation Needs: Not on file  Physical Activity: Not on file  Stress: Not on file  Social Connections: Not on file  Intimate Partner Violence: Not on file     Review of Systems  Constitutional:  Negative for chills, fatigue and unexpected weight change.  HENT:  Positive for postnasal drip. Negative for congestion, rhinorrhea, sneezing and sore throat.   Eyes:  Negative for redness.  Respiratory:  Negative for cough, chest tightness and shortness of breath.   Cardiovascular:  Negative for chest pain and palpitations.  Gastrointestinal:  Negative for abdominal pain, constipation, diarrhea, nausea and vomiting.  Genitourinary:  Negative for dysuria and frequency.  Musculoskeletal:  Negative for arthralgias, back pain, joint swelling and neck pain.  Skin:  Negative for rash.  Neurological: Negative.  Negative for tremors and numbness.  Hematological:  Negative for adenopathy. Does not bruise/bleed easily.  Psychiatric/Behavioral:  Negative for behavioral problems (Depression), sleep disturbance and suicidal ideas. The patient is not nervous/anxious.     Vital Signs: BP (!) 139/92   Pulse 82   Temp 97.8 F (36.6 C)   Resp 16   Ht 5' (1.524 m)   Wt 156 lb (70.8 kg)  SpO2 96%   BMI 30.47 kg/m    Physical Exam Constitutional:      Appearance: Normal appearance.  HENT:     Head: Normocephalic and atraumatic.     Nose: Nose normal.     Mouth/Throat:     Mouth: Mucous membranes are moist.     Pharynx: No posterior oropharyngeal erythema.  Eyes:     Extraocular Movements: Extraocular movements intact.     Pupils: Pupils are equal, round, and reactive to light.  Cardiovascular:     Pulses: Normal pulses.     Heart sounds: Normal heart sounds.  Pulmonary:     Effort: Pulmonary effort is normal.     Breath sounds: Normal breath sounds.  Neurological:      General: No focal deficit present.     Mental Status: She is alert.  Psychiatric:        Mood and Affect: Mood normal.        Behavior: Behavior normal.       Assessment/Plan:   General Counseling: Yarnell verbalizes understanding of the findings of todays visit and agrees with plan of treatment. I have discussed any further diagnostic evaluation that may be needed or ordered today. We also reviewed her medications today. she has been encouraged to call the office with any questions or concerns that should arise related to todays visit.    Counseling:  [place] Controlled Substance Database was reviewed by me.  No orders of the defined types were placed in this encounter.   No orders of the defined types were placed in this encounter.   Time spent:*** Minutes

## 2022-07-11 ENCOUNTER — Telehealth: Payer: Self-pay | Admitting: Nurse Practitioner

## 2022-07-11 NOTE — Telephone Encounter (Signed)
Awaiting 07/10/22 office notes for Dermatology referral-Toni

## 2022-07-14 ENCOUNTER — Telehealth: Payer: Self-pay | Admitting: Nurse Practitioner

## 2022-07-14 NOTE — Telephone Encounter (Signed)
Dermatology referral faxed to [facility] due to patient's insurance; [phone]

## 2022-07-18 ENCOUNTER — Telehealth: Payer: Self-pay

## 2022-07-19 NOTE — Telephone Encounter (Signed)
done

## 2022-07-20 ENCOUNTER — Encounter: Payer: Self-pay | Admitting: Nurse Practitioner

## 2022-07-20 ENCOUNTER — Ambulatory Visit (INDEPENDENT_AMBULATORY_CARE_PROVIDER_SITE_OTHER): Payer: BLUE CROSS/BLUE SHIELD | Admitting: Nurse Practitioner

## 2022-07-20 VITALS — BP 149/71 | HR 91 | Temp 97.3°F | Resp 16 | Ht 60.0 in | Wt 147.6 lb

## 2022-07-20 DIAGNOSIS — J0101 Acute recurrent maxillary sinusitis: Secondary | ICD-10-CM

## 2022-07-20 DIAGNOSIS — E559 Vitamin D deficiency, unspecified: Secondary | ICD-10-CM

## 2022-07-20 DIAGNOSIS — R030 Elevated blood-pressure reading, without diagnosis of hypertension: Secondary | ICD-10-CM | POA: Diagnosis not present

## 2022-07-20 DIAGNOSIS — E538 Deficiency of other specified B group vitamins: Secondary | ICD-10-CM | POA: Diagnosis not present

## 2022-07-20 DIAGNOSIS — B370 Candidal stomatitis: Secondary | ICD-10-CM | POA: Diagnosis not present

## 2022-07-20 MED ORDER — AMOXICILLIN 500 MG PO CAPS: 500.0000 mg | ORAL_CAPSULE | Freq: Two times a day (BID) | ORAL | 0 refills | Status: AC

## 2022-07-20 MED ORDER — PREDNISONE 10 MG (21) PO TBPK: ORAL_TABLET | ORAL | 0 refills | Status: DC

## 2022-07-20 MED ORDER — POSACONAZOLE 40 MG/ML PO SUSP: ORAL | 0 refills | Status: DC

## 2022-07-20 NOTE — Progress Notes (Signed)
[facility] [address] [place], [place] [address]  Internal MEDICINE  Office Visit Note  Patient Name: Taylor Navarro  [id number]  [id number]  Date of Service: 07/20/2022  Chief Complaint  Patient presents with   Follow-up    F/u bp     HPI Taylor Navarro presents for a follow-up visit for high blood pressure, sinus infection and thrush High blood pressure -- gets headaches, BP runs high when sick Sinusitis -- sinus pressure, headache, enlarged lymph nodes occipital. Postnasal drip, runny nose -- was treated with levaquin and only had mild improvement of symptoms  Thrush --oropharyngeal candidiasis, has been dealing with this problem for a few months. Was originally treated with nystatin oral suspension which did not help. She was given fluconazole to treat the thrush and this seemed to make it worse. One provider also gave her azithromycin per patient report which did not improve the thrush either.  Legs are cramping, started taking OTC iron supp and is feeling a little better. Wants to be evaluated for iron deficiency and B12 deficiency.   Current Medication: Outpatient Encounter Medications as of 07/20/2022  Medication Sig   amoxicillin (AMOXIL) 500 MG capsule Take 1 capsule (500 mg total) by mouth 2 (two) times daily for 10 days.   montelukast (SINGULAIR) 10 MG tablet Take 1 tablet (10 mg total) by mouth at bedtime.   posaconazole (NOXAFIL) 40 MG/ML suspension Take 10 ml by mouth twice daily x 3 days then continue taking 10 ml once daily for the next 25 days.   predniSONE (STERAPRED UNI-PAK 21 TAB) 10 MG (21) TBPK tablet Use as directed for 6 days   No facility-administered encounter medications on file as of 07/20/2022.    Surgical History: Past Surgical History:  Procedure Laterality Date   KNEE SURGERY     ovarian  2004   scar tissue removal   TUBAL LIGATION  1999    Medical History: Past Medical History:  Diagnosis Date   Bacterial infection due to H.  pylori    Gastritis     Family History: Family History  Problem Relation Age of Onset   Lung cancer Mother    Cancer Mother    Leukemia Father    Cancer Father     Social History   Socioeconomic History   Marital status: Married    Spouse name: Not on file   Number of children: Not on file   Years of education: Not on file   Highest education level: Not on file  Occupational History   Not on file  Tobacco Use   Smoking status: Never   Smokeless tobacco: Never  Substance and Sexual Activity   Alcohol use: Not Currently   Drug use: No   Sexual activity: Not Currently  Other Topics Concern   Not on file  Social History Narrative   Not on file   Social Determinants of Health   Financial Resource Strain: Not on file  Food Insecurity: Not on file  Transportation Needs: Not on file  Physical Activity: Not on file  Stress: Not on file  Social Connections: Not on file  Intimate Partner Violence: Not on file      Review of Systems  Constitutional:  Positive for fatigue. Negative for chills and unexpected weight change.  HENT:  Positive for postnasal drip, rhinorrhea, sinus pressure, sinus pain, sneezing, sore throat and trouble swallowing. Negative for congestion.        White coating on tongue and throat, spits up "  white chunks"  Respiratory:  Positive for cough. Negative for chest tightness, shortness of breath and wheezing.   Cardiovascular: Negative.  Negative for chest pain and palpitations.  Gastrointestinal:  Negative for abdominal pain, constipation, diarrhea, nausea and vomiting.  Musculoskeletal:  Negative for arthralgias, back pain, joint swelling and neck pain.  Skin:  Negative for rash.  Neurological:  Positive for headaches. Negative for tremors and numbness.  Hematological:  Negative for adenopathy. Does not bruise/bleed easily.  Psychiatric/Behavioral:  Negative for behavioral problems (Depression), sleep disturbance and suicidal ideas. The patient is  not nervous/anxious.     Vital Signs: BP (!) 149/71   Pulse 91   Temp (!) 97.3 F (36.3 C)   Resp 16   Ht 5' (1.524 m)   Wt 147 lb 9.6 oz (67 kg)   SpO2 97%   BMI 28.83 kg/m    Physical Exam Vitals reviewed.  Constitutional:      General: She is not in acute distress.    Appearance: Normal appearance. She is not ill-appearing.  HENT:     Head: Normocephalic and atraumatic.     Right Ear: Tympanic membrane, ear canal and external ear normal.     Left Ear: Tympanic membrane, ear canal and external ear normal.     Nose: Congestion and rhinorrhea present.     Mouth/Throat:     Mouth: Mucous membranes are dry.     Pharynx: Posterior oropharyngeal erythema present.     Comments: White tint/coating to tongue and throat Eyes:     Pupils: Pupils are equal, round, and reactive to light.  Cardiovascular:     Rate and Rhythm: Normal rate and regular rhythm.     Heart sounds: Normal heart sounds. No murmur heard. Pulmonary:     Effort: Pulmonary effort is normal. No respiratory distress.     Breath sounds: Normal breath sounds. No wheezing.  Neurological:     Mental Status: She is alert and oriented to person, place, and time.  Psychiatric:        Mood and Affect: Mood normal.        Behavior: Behavior normal.        Assessment/Plan: 1. Oropharyngeal candidiasis Treatment failure with nystatin and fluconazole. Recommended treatment for fluconazole-refractory candidiasis is itraconazole soln or posaconazole suspension. Itraconazole solution is not covered on her insurance. Posaconazole susp is covered by her insurance. A full 28-day treatment is recommended.  - posaconazole (NOXAFIL) 40 MG/ML suspension; Take 10 ml by mouth twice daily x 3 days then continue taking 10 ml once daily for the next 25 days.  Dispense: 315 mL; Refill: 0  2. Elevated blood pressure reading Reports elevated BP when she is sick, will wait and see if this improves once she is feeling better.  3.  Acute recurrent maxillary sinusitis Non responsive to levofloxacin, will try amoxicillin and a prednisone taper - predniSONE (STERAPRED UNI-PAK 21 TAB) 10 MG (21) TBPK tablet; Use as directed for 6 days  Dispense: 21 tablet; Refill: 0 - amoxicillin (AMOXIL) 500 MG capsule; Take 1 capsule (500 mg total) by mouth 2 (two) times daily for 10 days.  Dispense: 20 capsule; Refill: 0  4. B12 deficiency Hx low B12 and iron, labs ordered - CBC with Differential/Platelet - Iron, TIBC and Ferritin Panel - B12 and Folate Panel  5. Vitamin D deficiency Routine lab ordered - Vitamin D (25 hydroxy)   General Counseling: Taylor Navarro verbalizes understanding of the findings of todays visit and agrees with plan of  treatment. I have discussed any further diagnostic evaluation that may be needed or ordered today. We also reviewed her medications today. she has been encouraged to call the office with any questions or concerns that should arise related to todays visit.    Orders Placed This Encounter  Procedures   CBC with Differential/Platelet   Iron, TIBC and Ferritin Panel   B12 and Folate Panel   Vitamin D (25 hydroxy)    Meds ordered this encounter  Medications   predniSONE (STERAPRED UNI-PAK 21 TAB) 10 MG (21) TBPK tablet    Sig: Use as directed for 6 days    Dispense:  21 tablet    Refill:  0   amoxicillin (AMOXIL) 500 MG capsule    Sig: Take 1 capsule (500 mg total) by mouth 2 (two) times daily for 10 days.    Dispense:  20 capsule    Refill:  0   posaconazole (NOXAFIL) 40 MG/ML suspension    Sig: Take 10 ml by mouth twice daily x 3 days then continue taking 10 ml once daily for the next 25 days.    Dispense:  315 mL    Refill:  0    Return in about 1 week (around 07/27/2022) for F/U, BP check, Teyla Skidgel PCP.   Total time spent:30 Minutes Time spent includes review of chart, medications, test results, and follow up plan with the patient.   [place] Controlled Substance Database was reviewed by  me.  This patient was seen by Jonetta Osgood, FNP-C in collaboration with Dr. Clayborn Bigness as a part of collaborative care agreement.   Cyrstal Leitz R. Valetta Fuller, MSN, FNP-C Internal medicine

## 2022-07-25 ENCOUNTER — Telehealth: Payer: Self-pay

## 2022-07-25 ENCOUNTER — Other Ambulatory Visit: Payer: Self-pay | Admitting: Nurse Practitioner

## 2022-07-25 DIAGNOSIS — B370 Candidal stomatitis: Secondary | ICD-10-CM

## 2022-07-25 NOTE — Telephone Encounter (Signed)
Pt called that her copay is 1100 dollar and she already try other 2 med for yeast

## 2022-07-25 NOTE — Telephone Encounter (Signed)
Spoke with pt that as per dr Humphrey Rolls see dermatology first and asked her also about thrush we going hold for med until next appt

## 2022-07-25 NOTE — Telephone Encounter (Signed)
We cannot suggest treat with symptoms, she needs to have biopsy to make sure it is fungal infection, she was given a referral for dermatology

## 2022-07-25 NOTE — Telephone Encounter (Signed)
She might need GI, Let her see derm first

## 2022-07-25 NOTE — Telephone Encounter (Signed)
Patient was approved for Posaconazole, patient notified.

## 2022-07-27 ENCOUNTER — Encounter: Payer: Self-pay | Admitting: Nurse Practitioner

## 2022-07-27 ENCOUNTER — Ambulatory Visit (INDEPENDENT_AMBULATORY_CARE_PROVIDER_SITE_OTHER): Payer: BLUE CROSS/BLUE SHIELD | Admitting: Nurse Practitioner

## 2022-07-27 VITALS — BP 132/78 | HR 73 | Temp 97.1°F | Resp 16 | Ht 60.0 in | Wt 156.0 lb

## 2022-07-27 DIAGNOSIS — E538 Deficiency of other specified B group vitamins: Secondary | ICD-10-CM

## 2022-07-27 DIAGNOSIS — R635 Abnormal weight gain: Secondary | ICD-10-CM

## 2022-07-27 DIAGNOSIS — R252 Cramp and spasm: Secondary | ICD-10-CM

## 2022-07-27 DIAGNOSIS — R42 Dizziness and giddiness: Secondary | ICD-10-CM | POA: Diagnosis not present

## 2022-07-27 DIAGNOSIS — B370 Candidal stomatitis: Secondary | ICD-10-CM

## 2022-07-27 NOTE — Progress Notes (Signed)
[facility] [place], [place] [address]  Internal MEDICINE  Office Visit Note  Patient Name: Taylor Navarro  [id number]  [id number]  Date of Service: 07/27/2022  Chief Complaint  Patient presents with   Follow-up    B/p    HPI Quanesha presents for a follow-up visit for BP recheck.  Blood pressure recheck -- improved.  Sinus pressure is better, still dizzy, finishing atnibiotics,.  Going to derm to look at sebaceous cyst and possible oral thrush.  Having muscle cramps, dizziness, fatigue and wants to get additional labs drawn. Did not get initial labs drawn yet.     Current Medication: Outpatient Encounter Medications as of 07/27/2022  Medication Sig   amoxicillin (AMOXIL) 500 MG capsule Take 1 capsule (500 mg total) by mouth 2 (two) times daily for 10 days.   montelukast (SINGULAIR) 10 MG tablet Take 1 tablet (10 mg total) by mouth at bedtime.   posaconazole (NOXAFIL) 40 MG/ML suspension Take 10 ml by mouth twice daily x 3 days then continue taking 10 ml once daily for the next 25 days.   predniSONE (STERAPRED UNI-PAK 21 TAB) 10 MG (21) TBPK tablet Use as directed for 6 days   No facility-administered encounter medications on file as of 07/27/2022.    Surgical History: Past Surgical History:  Procedure Laterality Date   KNEE SURGERY     ovarian  2004   scar tissue removal   TUBAL LIGATION  1999    Medical History: Past Medical History:  Diagnosis Date   Bacterial infection due to H. pylori    Gastritis     Family History: Family History  Problem Relation Age of Onset   Lung cancer Mother    Cancer Mother    Leukemia Father    Cancer Father     Social History   Socioeconomic History   Marital status: Married    Spouse name: Not on file   Number of children: Not on file   Years of education: Not on file   Highest education level: Not on file  Occupational History   Not on file  Tobacco Use   Smoking status: Never   Smokeless  tobacco: Never  Substance and Sexual Activity   Alcohol use: Not Currently   Drug use: No   Sexual activity: Not Currently  Other Topics Concern   Not on file  Social History Narrative   Not on file   Social Determinants of Health   Financial Resource Strain: Not on file  Food Insecurity: Not on file  Transportation Needs: Not on file  Physical Activity: Not on file  Stress: Not on file  Social Connections: Not on file  Intimate Partner Violence: Not on file      Review of Systems  Constitutional:  Positive for fatigue. Negative for chills and unexpected weight change.  HENT:  Positive for postnasal drip, rhinorrhea, sinus pressure, sinus pain, sneezing, sore throat and trouble swallowing. Negative for congestion.        White coating on tongue and throat, spits up "white chunks"  Respiratory:  Positive for cough. Negative for chest tightness, shortness of breath and wheezing.   Cardiovascular: Negative.  Negative for chest pain and palpitations.  Gastrointestinal:  Negative for abdominal pain, constipation, diarrhea, nausea and vomiting.  Musculoskeletal:  Negative for arthralgias, back pain, joint swelling and neck pain.  Skin:  Negative for rash.  Neurological:  Positive for headaches. Negative for tremors and numbness.  Hematological:  Negative  for adenopathy. Does not bruise/bleed easily.  Psychiatric/Behavioral:  Negative for behavioral problems (Depression), sleep disturbance and suicidal ideas. The patient is not nervous/anxious.     Vital Signs: BP 132/78 Comment: 154/92  Pulse 73   Temp (!) 97.1 F (36.2 C)   Resp 16   Ht 5' (1.524 m)   Wt 156 lb (70.8 kg)   SpO2 98%   BMI 30.47 kg/m    Physical Exam Vitals reviewed.  Constitutional:      General: She is not in acute distress.    Appearance: Normal appearance. She is not ill-appearing.  HENT:     Head: Normocephalic and atraumatic.     Right Ear: Tympanic membrane, ear canal and external ear normal.      Left Ear: Tympanic membrane, ear canal and external ear normal.     Nose: Congestion and rhinorrhea present.     Mouth/Throat:     Mouth: Mucous membranes are dry.     Pharynx: Posterior oropharyngeal erythema present.     Comments: White tint/coating to tongue and throat Eyes:     Pupils: Pupils are equal, round, and reactive to light.  Cardiovascular:     Rate and Rhythm: Normal rate and regular rhythm.     Heart sounds: Normal heart sounds. No murmur heard. Pulmonary:     Effort: Pulmonary effort is normal. No respiratory distress.     Breath sounds: Normal breath sounds. No wheezing.  Neurological:     Mental Status: She is alert and oriented to person, place, and time.  Psychiatric:        Mood and Affect: Mood normal.        Behavior: Behavior normal.        Assessment/Plan: 1. Muscle cramps Labs ordered for further evaluation to rule out possible causes  - Lipid Profile - CMP14+EGFR - Hgb A1C w/o eAG - TSH + free T4 - Magnesium  2. Abnormal weight gain Labs ordered for further evaluation  - Lipid Profile - CMP14+EGFR - Hgb A1C w/o eAG - TSH + free T4 - Magnesium  3. Dizziness Additional labs ordered - Lipid Profile - CMP14+EGFR - Hgb A1C w/o eAG - TSH + free T4 - Magnesium  4. B12 deficiency Initial lab requisition sheet given to patient will discuss at next visit   5. Oropharyngeal candidiasis Wait for dermatology referral.   General Counseling: aseel truxillo understanding of the findings of todays visit and agrees with plan of treatment. I have discussed any further diagnostic evaluation that may be needed or ordered today. We also reviewed her medications today. she has been encouraged to call the office with any questions or concerns that should arise related to todays visit.    Orders Placed This Encounter  Procedures   Lipid Profile   CMP14+EGFR   Hgb A1C w/o eAG   TSH + free T4   Magnesium    No orders of the defined types  were placed in this encounter.   Return if symptoms worsen or fail to improve, for has upcoming appt in february to discuss lab results. .   Total time spent:30 Minutes Time spent includes review of chart, medications, test results, and follow up plan with the patient.   [place] Controlled Substance Database was reviewed by me.  This patient was seen by Jonetta Osgood, FNP-C in collaboration with Dr. Clayborn Bigness as a part of collaborative care agreement.   Lorenda Grecco R. Valetta Fuller, MSN, FNP-C Internal medicine

## 2022-07-28 ENCOUNTER — Other Ambulatory Visit: Payer: Self-pay | Admitting: Nurse Practitioner

## 2022-07-29 LAB — CBC WITH DIFFERENTIAL/PLATELET
Basophils Absolute: 0 10*3/uL (ref 0.0–0.2)
Basos: 1 %
EOS (ABSOLUTE): 0.1 10*3/uL (ref 0.0–0.4)
Eos: 2 %
Hematocrit: 43.5 % (ref 34.0–46.6)
Hemoglobin: 14.3 g/dL (ref 11.1–15.9)
Immature Grans (Abs): 0 10*3/uL (ref 0.0–0.1)
Immature Granulocytes: 0 %
Lymphocytes Absolute: 3.4 10*3/uL — ABNORMAL HIGH (ref 0.7–3.1)
Lymphs: 39 %
MCH: 28.3 pg (ref 26.6–33.0)
MCHC: 32.9 g/dL (ref 31.5–35.7)
MCV: 86 fL (ref 79–97)
Monocytes Absolute: 0.4 10*3/uL (ref 0.1–0.9)
Monocytes: 5 %
Neutrophils Absolute: 4.7 10*3/uL (ref 1.4–7.0)
Neutrophils: 53 %
Platelets: 345 10*3/uL (ref 150–450)
RBC: 5.05 x10E6/uL (ref 3.77–5.28)
RDW: 14 % (ref 11.7–15.4)
WBC: 8.8 10*3/uL (ref 3.4–10.8)

## 2022-07-29 LAB — CMP14+EGFR
ALT: 29 IU/L (ref 0–32)
AST: 28 IU/L (ref 0–40)
Albumin/Globulin Ratio: 1.8 (ref 1.2–2.2)
Albumin: 4.2 g/dL (ref 3.9–4.9)
Alkaline Phosphatase: 78 IU/L (ref 44–121)
BUN/Creatinine Ratio: 14 (ref 12–28)
BUN: 13 mg/dL (ref 8–27)
Bilirubin Total: 0.3 mg/dL (ref 0.0–1.2)
CO2: 23 mmol/L (ref 20–29)
Calcium: 9.4 mg/dL (ref 8.7–10.3)
Chloride: 103 mmol/L (ref 96–106)
Creatinine, Ser: 0.93 mg/dL (ref 0.57–1.00)
Globulin, Total: 2.4 g/dL (ref 1.5–4.5)
Glucose: 106 mg/dL — ABNORMAL HIGH (ref 70–99)
Potassium: 4.4 mmol/L (ref 3.5–5.2)
Sodium: 143 mmol/L (ref 134–144)
Total Protein: 6.6 g/dL (ref 6.0–8.5)
eGFR: 70 mL/min/{1.73_m2} (ref >59)

## 2022-07-29 LAB — B12 AND FOLATE PANEL
Folate: >20 ng/mL (ref >3.0)
Vitamin B-12: 869 pg/mL (ref 232–1245)

## 2022-07-29 LAB — TSH+FREE T4
Free T4: 1.26 ng/dL (ref 0.82–1.77)
TSH: 2.26 u[IU]/mL (ref 0.450–4.500)

## 2022-07-29 LAB — IRON,TIBC AND FERRITIN PANEL
Ferritin: 71 ng/mL (ref 15–150)
Iron Saturation: 37 % (ref 15–55)
Iron: 115 ug/dL (ref 27–139)
Total Iron Binding Capacity: 311 ug/dL (ref 250–450)
UIBC: 196 ug/dL (ref 118–369)

## 2022-07-29 LAB — VITAMIN D 25 HYDROXY (VIT D DEFICIENCY, FRACTURES): Vit D, 25-Hydroxy: 58.9 ng/mL (ref 30.0–100.0)

## 2022-07-29 LAB — MAGNESIUM: Magnesium: 2.1 mg/dL (ref 1.6–2.3)

## 2022-07-29 LAB — LIPID PANEL
Chol/HDL Ratio: 3.2 ratio (ref 0.0–4.4)
Cholesterol, Total: 186 mg/dL (ref 100–199)
HDL: 59 mg/dL (ref >39)
LDL Chol Calc (NIH): 97 mg/dL (ref 0–99)
Triglycerides: 174 mg/dL — ABNORMAL HIGH (ref 0–149)
VLDL Cholesterol Cal: 30 mg/dL (ref 5–40)

## 2022-07-29 LAB — HGB A1C W/O EAG: Hgb A1c MFr Bld: 6.1 % — ABNORMAL HIGH (ref 4.8–5.6)

## 2022-08-14 ENCOUNTER — Ambulatory Visit (INDEPENDENT_AMBULATORY_CARE_PROVIDER_SITE_OTHER): Payer: BLUE CROSS/BLUE SHIELD | Admitting: Nurse Practitioner

## 2022-08-14 ENCOUNTER — Encounter: Payer: Self-pay | Admitting: Nurse Practitioner

## 2022-08-14 VITALS — BP 130/76 | HR 67 | Temp 97.4°F | Resp 16 | Ht 60.0 in | Wt 159.8 lb

## 2022-08-14 DIAGNOSIS — Z0001 Encounter for general adult medical examination with abnormal findings: Secondary | ICD-10-CM | POA: Diagnosis not present

## 2022-08-14 DIAGNOSIS — R1033 Periumbilical pain: Secondary | ICD-10-CM

## 2022-08-14 DIAGNOSIS — R7303 Prediabetes: Secondary | ICD-10-CM

## 2022-08-14 DIAGNOSIS — R300 Dysuria: Secondary | ICD-10-CM | POA: Diagnosis not present

## 2022-08-14 MED ORDER — RIFAXIMIN 550 MG PO TABS: 550.0000 mg | ORAL_TABLET | Freq: Three times a day (TID) | ORAL | 0 refills | Status: DC

## 2022-08-14 NOTE — Progress Notes (Signed)
[facility] [place], [place] [address]  Internal MEDICINE  Office Visit Note  Patient Name: Taylor Navarro  [id number]  [id number]  Date of Service: 08/14/2022  Chief Complaint  Patient presents with   Annual Exam    HPI Taylor Navarro presents for an annual well visit and physical exam.  Well-appearing 62 y.o. female with  Routine CRC screening: done 3-4 years ago Routine mammogram: declined  Pap smear: due now will schedule later per patient Labs: labs discussed Prediabetes 6.1 a1c New or worsening pain: none Other concerns:  Pink tinged urine x1  Diarrhea x1 day  BP improved  Hx Hpylori in the past    Current Medication: Outpatient Encounter Medications as of 08/14/2022  Medication Sig   montelukast (SINGULAIR) 10 MG tablet Take 1 tablet (10 mg total) by mouth at bedtime.   posaconazole (NOXAFIL) 40 MG/ML suspension Take 10 ml by mouth twice daily x 3 days then continue taking 10 ml once daily for the next 25 days.   predniSONE (STERAPRED UNI-PAK 21 TAB) 10 MG (21) TBPK tablet Use as directed for 6 days   rifaximin (XIFAXAN) 550 MG TABS tablet Take 1 tablet (550 mg total) by mouth 3 (three) times daily.   No facility-administered encounter medications on file as of 08/14/2022.    Surgical History: Past Surgical History:  Procedure Laterality Date   KNEE SURGERY     ovarian  2004   scar tissue removal   TUBAL LIGATION  1999    Medical History: Past Medical History:  Diagnosis Date   Bacterial infection due to H. pylori    Gastritis     Family History: Family History  Problem Relation Age of Onset   Lung cancer Mother    Cancer Mother    Leukemia Father    Cancer Father     Social History   Socioeconomic History   Marital status: Married    Spouse name: Not on file   Number of children: Not on file   Years of education: Not on file   Highest education level: Not on file  Occupational History   Not on file  Tobacco Use    Smoking status: Never   Smokeless tobacco: Never  Substance and Sexual Activity   Alcohol use: Not Currently   Drug use: No   Sexual activity: Not Currently  Other Topics Concern   Not on file  Social History Narrative   Not on file   Social Determinants of Health   Financial Resource Strain: Not on file  Food Insecurity: Not on file  Transportation Needs: Not on file  Physical Activity: Not on file  Stress: Not on file  Social Connections: Not on file  Intimate Partner Violence: Not on file      Review of Systems  Constitutional:  Negative for activity change, appetite change, chills, fatigue, fever and unexpected weight change.  HENT: Negative.  Negative for congestion, ear pain, rhinorrhea, sore throat and trouble swallowing.   Eyes: Negative.   Respiratory: Negative.  Negative for cough, chest tightness, shortness of breath and wheezing.   Cardiovascular: Negative.  Negative for chest pain and palpitations.  Gastrointestinal: Negative.  Negative for abdominal pain, blood in stool, constipation, diarrhea, nausea and vomiting.  Endocrine: Negative.   Genitourinary: Negative.  Negative for difficulty urinating, dysuria, frequency, hematuria and urgency.  Musculoskeletal: Negative.  Negative for arthralgias, back pain, joint swelling, myalgias and neck pain.  Skin: Negative.  Negative for rash and wound.  Allergic/Immunologic: Negative.  Negative for immunocompromised state.  Neurological: Negative.  Negative for dizziness, seizures, numbness and headaches.  Hematological: Negative.   Psychiatric/Behavioral: Negative.  Negative for behavioral problems, self-injury and suicidal ideas. The patient is not nervous/anxious.     Vital Signs: BP 130/76   Pulse 67   Temp (!) 97.4 F (36.3 C)   Resp 16   Ht 5' (1.524 m)   Wt 159 lb 12.8 oz (72.5 kg)   SpO2 97%   BMI 31.21 kg/m    Physical Exam Vitals reviewed.  Constitutional:      General: She is awake. She is not in  acute distress.    Appearance: Normal appearance. She is well-developed and well-groomed. She is not ill-appearing or diaphoretic.  HENT:     Head: Normocephalic and atraumatic.     Right Ear: Tympanic membrane, ear canal and external ear normal.     Left Ear: Tympanic membrane, ear canal and external ear normal.     Nose: Nose normal. No congestion or rhinorrhea.     Mouth/Throat:     Lips: Pink.     Mouth: Mucous membranes are moist.     Pharynx: Oropharynx is clear. Uvula midline. No oropharyngeal exudate or posterior oropharyngeal erythema.  Eyes:     General: Lids are normal. Vision grossly intact. Gaze aligned appropriately. No scleral icterus.       Right eye: No discharge.        Left eye: No discharge.     Extraocular Movements: Extraocular movements intact.     Conjunctiva/sclera: Conjunctivae normal.     Pupils: Pupils are equal, round, and reactive to light.  Neck:     Thyroid: No thyromegaly.     Vascular: No carotid bruit or JVD.     Trachea: Trachea and phonation normal. No tracheal deviation.  Cardiovascular:     Rate and Rhythm: Normal rate and regular rhythm.     Pulses: Normal pulses.     Heart sounds: Normal heart sounds, S1 normal and S2 normal. No murmur heard.    No friction rub. No gallop.  Pulmonary:     Effort: Pulmonary effort is normal. No accessory muscle usage or respiratory distress.     Breath sounds: Normal breath sounds and air entry. No stridor. No wheezing or rales.  Chest:     Chest wall: No tenderness.  Abdominal:     General: Bowel sounds are normal. There is no distension.     Palpations: Abdomen is soft. There is no mass.     Tenderness: There is no abdominal tenderness. There is no guarding or rebound.  Musculoskeletal:        General: No tenderness or deformity. Normal range of motion.     Cervical back: Normal range of motion and neck supple.     Right lower leg: No edema.     Left lower leg: No edema.  Lymphadenopathy:      Cervical: No cervical adenopathy.  Skin:    General: Skin is warm and dry.     Capillary Refill: Capillary refill takes less than 2 seconds.     Coloration: Skin is not pale.     Findings: No erythema or rash.  Neurological:     Mental Status: She is alert and oriented to person, place, and time.     Cranial Nerves: No cranial nerve deficit.     Motor: No abnormal muscle tone.     Coordination: Coordination normal.  Gait: Gait normal.     Deep Tendon Reflexes: Reflexes are normal and symmetric.  Psychiatric:        Mood and Affect: Mood and affect normal.        Behavior: Behavior normal. Behavior is cooperative.        Thought Content: Thought content normal.        Judgment: Judgment normal.        Assessment/Plan: 1. Encounter for routine adult health examination with abnormal findings Age-appropriate preventive screenings and vaccinations discussed, annual physical exam completed. Routine labs for health maintenance ordered, results reviewed . PHM updated.   2. Abdominal cramping, periumbilical Take xifaxan as prescribed for 2 weeks.  - rifaximin (XIFAXAN) 550 MG TABS tablet; Take 1 tablet (550 mg total) by mouth 3 (three) times daily.  Dispense: 42 tablet; Refill: 0  3. Prediabetes  A1c at 6.1, discussed diet and lifestyle modifications   4. Dysuria Routine urinalysis done - UA/M w/rflx Culture, Routine - Microscopic Examination      General Counseling: Dyneshia verbalizes understanding of the findings of todays visit and agrees with plan of treatment. I have discussed any further diagnostic evaluation that may be needed or ordered today. We also reviewed her medications today. she has been encouraged to call the office with any questions or concerns that should arise related to todays visit.    Orders Placed This Encounter  Procedures   Urinalysis, Routine w reflex microscopic    Meds ordered this encounter  Medications   rifaximin (XIFAXAN) 550 MG TABS  tablet    Sig: Take 1 tablet (550 mg total) by mouth 3 (three) times daily.    Dispense:  42 tablet    Refill:  0    Return in about 2 months (around 10/13/2022) for F/U, Cooper Stamp PCP.   Total time spent:30 Minutes Time spent includes review of chart, medications, test results, and follow up plan with the patient.   [place] Controlled Substance Database was reviewed by me.  This patient was seen by Jonetta Osgood, FNP-C in collaboration with Dr. Clayborn Bigness as a part of collaborative care agreement.  Arasely Akkerman R. Valetta Fuller, MSN, FNP-C Internal medicine

## 2022-08-15 LAB — UA/M W/RFLX CULTURE, ROUTINE
Bilirubin, UA: NEGATIVE
Glucose, UA: NEGATIVE
Ketones, UA: NEGATIVE
Leukocytes,UA: NEGATIVE
Nitrite, UA: NEGATIVE
Protein,UA: NEGATIVE
RBC, UA: NEGATIVE
Specific Gravity, UA: 1.021 (ref 1.005–1.030)
Urobilinogen, Ur: 0.2 mg/dL (ref 0.2–1.0)
pH, UA: 6.5 (ref 5.0–7.5)

## 2022-08-15 LAB — MICROSCOPIC EXAMINATION
Bacteria, UA: NONE SEEN
Casts: NONE SEEN /lpf
RBC, Urine: NONE SEEN /hpf (ref 0–2)

## 2022-08-25 ENCOUNTER — Encounter: Payer: Self-pay | Admitting: Nurse Practitioner

## 2022-10-13 ENCOUNTER — Ambulatory Visit: Payer: BLUE CROSS/BLUE SHIELD | Admitting: Nurse Practitioner

## 2023-02-02 ENCOUNTER — Other Ambulatory Visit: Payer: Self-pay | Admitting: Internal Medicine

## 2023-02-02 DIAGNOSIS — K219 Gastro-esophageal reflux disease without esophagitis: Secondary | ICD-10-CM

## 2023-02-05 ENCOUNTER — Telehealth: Payer: Self-pay | Admitting: Nurse Practitioner

## 2023-02-05 NOTE — Telephone Encounter (Signed)
Pt called needing refill on reflux medication, informed pt that pt will nee to schedule an appt to get medication refilled. Pt was insisting it should be refilled automatically because of previous testing done at different office. Explained to pt that we never prescribe medication to her and that she hasn't been seen sense February of this year. Pt stated that she will not pay another doctor to collect money just to be seen and that she will call Dr Yves Dill to refill medication-nm

## 2023-04-02 ENCOUNTER — Emergency Department
Admission: EM | Admit: 2023-04-02 | Discharge: 2023-04-02 | Disposition: A | Discharge status: Home / Self Care | Payer: BLUE CROSS/BLUE SHIELD | Attending: Emergency Medicine | Admitting: Emergency Medicine

## 2023-04-02 ENCOUNTER — Telehealth: Payer: Self-pay

## 2023-04-02 ENCOUNTER — Encounter: Payer: Self-pay | Admitting: Internal Medicine

## 2023-04-02 ENCOUNTER — Emergency Department: Payer: BLUE CROSS/BLUE SHIELD

## 2023-04-02 ENCOUNTER — Telehealth (INDEPENDENT_AMBULATORY_CARE_PROVIDER_SITE_OTHER): Payer: BLUE CROSS/BLUE SHIELD | Admitting: Internal Medicine

## 2023-04-02 ENCOUNTER — Other Ambulatory Visit: Payer: Self-pay

## 2023-04-02 VITALS — Ht 65.0 in | Wt 145.0 lb

## 2023-04-02 DIAGNOSIS — R112 Nausea with vomiting, unspecified: Secondary | ICD-10-CM | POA: Diagnosis not present

## 2023-04-02 DIAGNOSIS — K529 Noninfective gastroenteritis and colitis, unspecified: Secondary | ICD-10-CM

## 2023-04-02 DIAGNOSIS — R1033 Periumbilical pain: Secondary | ICD-10-CM | POA: Diagnosis not present

## 2023-04-02 DIAGNOSIS — R197 Diarrhea, unspecified: Secondary | ICD-10-CM | POA: Diagnosis not present

## 2023-04-02 DIAGNOSIS — R1031 Right lower quadrant pain: Secondary | ICD-10-CM | POA: Insufficient documentation

## 2023-04-02 LAB — COMPREHENSIVE METABOLIC PANEL
ALT: 24 U/L (ref 0–44)
AST: 27 U/L (ref 15–41)
Albumin: 4.1 g/dL (ref 3.5–5.0)
Alkaline Phosphatase: 67 U/L (ref 38–126)
Anion gap: 13 (ref 5–15)
BUN: 16 mg/dL (ref 8–23)
CO2: 22 mmol/L (ref 22–32)
Calcium: 8.9 mg/dL (ref 8.9–10.3)
Chloride: 104 mmol/L (ref 98–111)
Creatinine, Ser: 0.91 mg/dL (ref 0.44–1.00)
GFR, Estimated: >60 mL/min (ref >60)
Glucose, Bld: 96 mg/dL (ref 70–99)
Potassium: 3.8 mmol/L (ref 3.5–5.1)
Sodium: 139 mmol/L (ref 135–145)
Total Bilirubin: 0.9 mg/dL (ref 0.3–1.2)
Total Protein: 7.4 g/dL (ref 6.5–8.1)

## 2023-04-02 LAB — CBC
HCT: 45.9 % (ref 36.0–46.0)
Hemoglobin: 15.2 g/dL — ABNORMAL HIGH (ref 12.0–15.0)
MCH: 28.5 pg (ref 26.0–34.0)
MCHC: 33.1 g/dL (ref 30.0–36.0)
MCV: 86 fL (ref 80.0–100.0)
Platelets: 343 10*3/uL (ref 150–400)
RBC: 5.34 MIL/uL — ABNORMAL HIGH (ref 3.87–5.11)
RDW: 12.8 % (ref 11.5–15.5)
WBC: 10.5 10*3/uL (ref 4.0–10.5)
nRBC: 0 % (ref 0.0–0.2)

## 2023-04-02 LAB — LIPASE, BLOOD: Lipase: 25 U/L (ref 11–51)

## 2023-04-02 LAB — URINALYSIS, ROUTINE W REFLEX MICROSCOPIC
Bilirubin Urine: NEGATIVE
Glucose, UA: NEGATIVE mg/dL
Hgb urine dipstick: NEGATIVE
Ketones, ur: 5 mg/dL — AB
Leukocytes,Ua: NEGATIVE
Nitrite: NEGATIVE
Protein, ur: NEGATIVE mg/dL
Specific Gravity, Urine: 1.012 (ref 1.005–1.030)
pH: 6 (ref 5.0–8.0)

## 2023-04-02 MED ORDER — DICYCLOMINE HCL 10 MG PO CAPS: 10.0000 mg | ORAL_CAPSULE | Freq: Three times a day (TID) | ORAL | 0 refills | Status: DC

## 2023-04-02 MED ORDER — IOHEXOL 300 MG/ML  SOLN
100.0000 mL | Freq: Once | INTRAMUSCULAR | Status: AC | PRN
  Administered 2023-04-02: 100 mL via INTRAVENOUS

## 2023-04-02 MED ORDER — METRONIDAZOLE 500 MG PO TABS
500.0000 mg | ORAL_TABLET | Freq: Two times a day (BID) | ORAL | 0 refills | Status: DC
Start: 2023-04-02 — End: 2023-10-26

## 2023-04-02 NOTE — Telephone Encounter (Signed)
Pt called that her abdominal pain is getting worse not better as per  dr Welton Flakes advised her go to ER

## 2023-04-02 NOTE — ED Notes (Signed)
 Provided pt with discharge instructions and education. All of pt questions answered. Pt in possession of all belongings. Pt AAOX4 and stable at time of discharge.Pt ambulated w/ steady gait towards ED exit.

## 2023-04-02 NOTE — Telephone Encounter (Signed)
Lmom to call us back 

## 2023-04-02 NOTE — ED Notes (Signed)
PA at bedside.

## 2023-04-02 NOTE — ED Triage Notes (Signed)
Pt to ED for abd pain and emesis started 2 days ago. Reports hx of gastritis. Doctor started her on flagyl and called in anti emetics.

## 2023-04-02 NOTE — ED Notes (Signed)
See triage note  Presents with 2-3 days ago n/v    Also having some slight abd discomfort  No fever

## 2023-04-02 NOTE — Discharge Instructions (Signed)
Your CT scan is pending at the time of discharge. The exact source and cause of your symptoms is not known at this time. Take the prescription meds as directed.

## 2023-04-02 NOTE — ED Notes (Signed)
Assumed care of pt at this time. Pt is AAXO4, pt requesting IV be removed. Notified pt that her cat scan results are not back yet. Pt acknowledged understanding and stated that she will have her doctor read her results when available. IV removed as pt requested. Made PA aware and requested she speak to pt ASAP.

## 2023-04-02 NOTE — ED Notes (Signed)
Pt ambulated to restroom and back to bed safely.

## 2023-04-02 NOTE — Progress Notes (Signed)
[facility] [address] [place], [place] [address]  Internal MEDICINE  Telephone Visit  Patient Name: Taylor Navarro  [id number]  [id number]  Date of Service: 04/02/2023  I connected with the patient at 1140 am by telephone and verified the patients identity using two identifiers.   I discussed the limitations, risks, security and privacy concerns of performing an evaluation and management service by telephone and the availability of in person appointments. I also discussed with the patient that there may be a patient responsible charge related to the service.  The patient expressed understanding and agrees to proceed.    Chief Complaint  Patient presents with   Telephone Assessment    [id number]   Telephone Screen    Food poisoning    Diarrhea    Since Saturday    Abdominal Pain    On and off     HPI Pt is connected via tele visit for sick visit Went out to eat, after 2 hours she started having abdominal pain, nausea and vomiting, last BM 2 hours ago She is still have abdominal pain and severe cramping  Does have Zofran at home and took a dose this am     Current Medication: Outpatient Encounter Medications as of 04/02/2023  Medication Sig   metroNIDAZOLE (FLAGYL) 500 MG tablet Take 1 tablet (500 mg total) by mouth 2 (two) times daily.   montelukast (SINGULAIR) 10 MG tablet Take 1 tablet (10 mg total) by mouth at bedtime.   pantoprazole (PROTONIX) 40 MG tablet TAKE 1 TABLET BY MOUTH EVERY DAY   posaconazole (NOXAFIL) 40 MG/ML suspension Take 10 ml by mouth twice daily x 3 days then continue taking 10 ml once daily for the next 25 days.   predniSONE (STERAPRED UNI-PAK 21 TAB) 10 MG (21) TBPK tablet Use as directed for 6 days   rifaximin (XIFAXAN) 550 MG TABS tablet Take 1 tablet (550 mg total) by mouth 3 (three) times daily.   No facility-administered encounter medications on file as of 04/02/2023.    Surgical History: Past Surgical History:  Procedure  Laterality Date   KNEE SURGERY     ovarian  2004   scar tissue removal   TUBAL LIGATION  1999    Medical History: Past Medical History:  Diagnosis Date   Bacterial infection due to H. pylori    Gastritis     Family History: Family History  Problem Relation Age of Onset   Lung cancer Mother    Cancer Mother    Leukemia Father    Cancer Father     Social History   Socioeconomic History   Marital status: Married    Spouse name: Not on file   Number of children: Not on file   Years of education: Not on file   Highest education level: Not on file  Occupational History   Not on file  Tobacco Use   Smoking status: Never   Smokeless tobacco: Never  Substance and Sexual Activity   Alcohol use: Not Currently   Drug use: No   Sexual activity: Not Currently  Other Topics Concern   Not on file  Social History Narrative   Not on file   Social Determinants of Health   Financial Resource Strain: Not on file  Food Insecurity: Not on file  Transportation Needs: Not on file  Physical Activity: Not on file  Stress: Not on file  Social Connections: Not on file  Intimate Partner Violence: Not on file  Review of Systems  Constitutional:  Negative for fatigue and fever.  HENT:  Negative for congestion, mouth sores and postnasal drip.   Respiratory:  Negative for cough.   Cardiovascular:  Negative for chest pain.  Gastrointestinal:  Positive for abdominal pain.  Genitourinary:  Negative for flank pain.  Musculoskeletal:  Negative for gait problem.  Psychiatric/Behavioral: Negative.      Vital Signs: Ht 5\' 5"  (1.651 m)   Wt 145 lb (65.8 kg)   BMI 24.13 kg/m    Observation/Objective:  She is uncomfortable    Assessment/Plan: 1. Acute gastroenteritis Pt is instructed to drink plenty of liquids, advance diet from clear liquids to broth and soft diet  - metroNIDAZOLE (FLAGYL) 500 MG tablet; Take 1 tablet (500 mg total) by mouth 2 (two) times daily.  Dispense:  14 tablet; Refill: 0  2. Periumbilical abdominal pain Educated about acute appendicitis   General Counseling: Suzannah verbalizes understanding of the findings of today's phone visit and agrees with plan of treatment. I have discussed any further diagnostic evaluation that may be needed or ordered today. We also reviewed her medications today. she has been encouraged to call the office with any questions or concerns that should arise related to todays visit.    No orders of the defined types were placed in this encounter.   Meds ordered this encounter  Medications   metroNIDAZOLE (FLAGYL) 500 MG tablet    Sig: Take 1 tablet (500 mg total) by mouth 2 (two) times daily.    Dispense:  14 tablet    Refill:  0    Time spent:15 Minutes    Dr Lyndon Code Internal medicine

## 2023-04-02 NOTE — ED Provider Notes (Signed)
[facility] Emergency Department Provider Note     Event Date/Time   First MD Initiated Contact with Patient 04/02/23 1615     (approximate)   History   Emesis and Abdominal Pain   HPI  Taylor Navarro is a 62 y.o. female with a history of gastritis, H. pylori, GERD, gout, and osteoarthritis, presents to the ED for 2 days of abdominal pain, diarrhea, and emesis.  She notes her doctor sent her in a prescription for Flagyl as well as antiemetics but she denies any fevers, chills, sweats, chest pain, shortness of breath.  She presents to the ED at the advice of her primary provider after she endorsed periumbilical pain as well as right lower quadrant greater than left lower quadrant abdominal pain.   Physical Exam   Triage Vital Signs: ED Triage Vitals  Encounter Vitals Group     BP 04/02/23 1432 104/84     Systolic BP Percentile --      Diastolic BP Percentile --      Pulse Rate 04/02/23 1432 90     Resp 04/02/23 1432 18     Temp 04/02/23 1432 97.7 F (36.5 C)     Temp src --      SpO2 04/02/23 1432 99 %     Weight --      Height 04/02/23 1621 5\' 5"  (1.651 m)     Head Circumference --      Peak Flow --      Pain Score 04/02/23 1432 3     Pain Loc --      Pain Education --      Exclude from Growth Chart --     Most recent vital signs: Vitals:   04/02/23 1844 04/02/23 1910  BP: 110/80 118/84  Pulse: 88 78  Resp: 18 16  Temp: 98 F (36.7 C) 98.2 F (36.8 C)  SpO2: 99% 100%    General Awake, no distress. NAD HEENT NCAT. PERRL. EOMI. No rhinorrhea. Mucous membranes are moist. CV:  Good peripheral perfusion. RRR RESP:  Normal effort. CTA ABD:  No distention.  Tender to palpation to the right lower quadrant on exam.  Normal active bowel sounds noted.  Rebound, guarding, or rigidity appreciated.  ED Results / Procedures / Treatments   Labs (all labs ordered are listed, but only abnormal results are displayed) Labs Reviewed  CBC -  Abnormal; Notable for the following components:      Result Value   RBC 5.34 (*)    Hemoglobin 15.2 (*)    All other components within normal limits  URINALYSIS, ROUTINE W REFLEX MICROSCOPIC - Abnormal; Notable for the following components:   Color, Urine YELLOW (*)    APPearance HAZY (*)    Ketones, ur 5 (*)    All other components within normal limits  LIPASE, BLOOD  COMPREHENSIVE METABOLIC PANEL     EKG   RADIOLOGY  Provider interpretation; evidence of enteritis but no evidence of of acute appendicitis  CT ABDOMEN PELVIS W CONTRAST  Result Date: 04/02/2023 CLINICAL DATA:  Lower abdominal pain for 2 days, initial encounter EXAM: CT ABDOMEN AND PELVIS WITH CONTRAST TECHNIQUE: Multidetector CT imaging of the abdomen and pelvis was performed using the standard protocol following bolus administration of intravenous contrast. RADIATION DOSE REDUCTION: This exam was performed according to the departmental dose-optimization program which includes automated exposure control, adjustment of the mA and/or kV according to patient size and/or use of iterative reconstruction technique. CONTRAST:  OMNIPAQUE IOHEXOL 300 MG/ML  SOLN COMPARISON:  06/03/2018 FINDINGS: Lower chest: No acute abnormality. Hepatobiliary: No focal liver abnormality is seen. No gallstones, gallbladder wall thickening, or biliary dilatation. Pancreas: Unremarkable. No pancreatic ductal dilatation or surrounding inflammatory changes. Spleen: Normal in size without focal abnormality. Adrenals/Urinary Tract: Adrenal glands are within normal limits. Kidneys are well visualized bilaterally. Normal enhancement pattern is seen. No calculi are noted. Ureters are within normal limits. Bladder is decompressed. Stomach/Bowel: The appendix is within normal limits. No obstructive or inflammatory changes of the colon are seen. Multiple edematous loops of small bowel are noted similar to that seen on the prior exam. Reactive free fluid in  the abdomen is noted. No definitive obstructive changes are seen. These changes are most consistent with enteritis. Stomach appears within normal limits. Vascular/Lymphatic: No significant vascular findings are present. No enlarged abdominal or pelvic lymph nodes. Reproductive: Uterus and bilateral adnexa are unremarkable. Other: Mild free fluid is noted within the pelvis likely reactive to the inflamed small bowel. Musculoskeletal: No acute or significant osseous findings. IMPRESSION: Multiple inflamed loops of small bowel within the left mid abdomen similar to that seen on the prior exam. No obstructive changes are seen. These findings are most consistent with enteritis. Some reactive free fluid in the abdomen is seen. No other focal abnormality is noted. Electronically Signed   By: Alcide Clever M.D.   On: 04/02/2023 20:29     PROCEDURES:  Critical Care performed: No  Procedures   MEDICATIONS ORDERED IN ED: Medications  iohexol (OMNIPAQUE) 300 MG/ML solution 100 mL (100 mLs Intravenous Contrast Given 04/02/23 1744)     IMPRESSION / MDM / ASSESSMENT AND PLAN / ED COURSE  I reviewed the triage vital signs and the nursing notes.                              Differential diagnosis includes, but is not limited to, biliary disease (biliary colic, acute cholecystitis, cholangitis, choledocholithiasis, etc), intrathoracic causes for epigastric abdominal pain including ACS, gastritis, duodenitis, pancreatitis, small bowel or large bowel obstruction, abdominal aortic aneurysm, hernia, and ulcer(s).   Patient's presentation is most consistent with acute complicated illness / injury requiring diagnostic workup.  Patient's diagnosis is consistent with enteritis without evidence of acute appendicitis.  Otherwise reassuring exam and workup at this time.  No acute leukocytosis or critical anemia is noted.  No UA evidence of infectious process at this time.  Patient will be discharged home with  prescriptions for Bentyl to take in addition to the previously prescribed Zofran and Flagyl. Patient is to follow up with her primary provider as discussed, as needed or otherwise directed. Patient is given ED precautions to return to the ED for any worsening or new symptoms.  ----------------------------------------- 7:18 PM on 04/02/2023 ----------------------------------------- Patient decided to leave the ED prior to the read on her CT scan given the protracted wait for results at this time patient otherwise stable condition and is requesting discharge paperwork.  She will follow-up with her primary provider as discussed.  She will call back to the hospital or monitor her results on: MyChart.  ----------------------------------------- 11:19 PM on 04/02/2023 ----------------------------------------- Spoke to the patient via telephone to advise her that her CT scan results did not reveal an acute appendicitis.  She does have evidence however, of an enteritis of an unclear etiology.  Patient will continue with her previously prescribed medications.  She will follow-up with primary provider  return to ED as discussed.    FINAL CLINICAL IMPRESSION(S) / ED DIAGNOSES   Final diagnoses:  Right lower quadrant abdominal pain  Nausea vomiting and diarrhea     Rx / DC Orders   ED Discharge Orders          Ordered    dicyclomine (BENTYL) 10 MG capsule  3 times daily before meals        04/02/23 1916             Note:  This document was prepared using Dragon voice recognition software and may include unintentional dictation errors.    Lissa Hoard, PA-C 04/02/23 2359    Dionne Bucy, MD 04/04/23 [id number]

## 2023-04-03 ENCOUNTER — Telehealth: Payer: Self-pay

## 2023-04-03 ENCOUNTER — Other Ambulatory Visit: Payer: Self-pay

## 2023-04-03 MED ORDER — CIPROFLOXACIN HCL 500 MG PO TABS: 500.0000 mg | ORAL_TABLET | Freq: Two times a day (BID) | ORAL | 0 refills | Status: DC

## 2023-04-03 NOTE — Telephone Encounter (Addendum)
Notified patient that [facility] sent Cipro to pharmacy. Advised patient to call her GI office if she is still having concerns regarding her stomach pain.

## 2023-04-03 NOTE — Telephone Encounter (Signed)
Please add cipro 500 mg bid x 7 days # 14, continue with Flagyl as before

## 2023-07-30 ENCOUNTER — Other Ambulatory Visit: Payer: Self-pay | Admitting: Internal Medicine

## 2023-07-30 DIAGNOSIS — K219 Gastro-esophageal reflux disease without esophagitis: Secondary | ICD-10-CM

## 2023-08-20 ENCOUNTER — Encounter: Payer: Self-pay | Admitting: Nurse Practitioner

## 2023-09-14 ENCOUNTER — Encounter: Payer: Self-pay | Admitting: Nurse Practitioner

## 2023-10-26 ENCOUNTER — Encounter: Payer: Self-pay | Admitting: Nurse Practitioner

## 2023-10-26 ENCOUNTER — Ambulatory Visit: Payer: Self-pay | Admitting: Nurse Practitioner

## 2023-10-26 VITALS — BP 138/88 | HR 76 | Temp 97.0°F | Resp 16 | Ht 60.0 in | Wt 157.6 lb

## 2023-10-26 DIAGNOSIS — B370 Candidal stomatitis: Secondary | ICD-10-CM | POA: Diagnosis not present

## 2023-10-26 DIAGNOSIS — M130 Polyarthritis, unspecified: Secondary | ICD-10-CM | POA: Diagnosis not present

## 2023-10-26 DIAGNOSIS — Z0001 Encounter for general adult medical examination with abnormal findings: Secondary | ICD-10-CM | POA: Diagnosis not present

## 2023-10-26 DIAGNOSIS — M8580 Other specified disorders of bone density and structure, unspecified site: Secondary | ICD-10-CM | POA: Diagnosis not present

## 2023-10-26 DIAGNOSIS — E2839 Other primary ovarian failure: Secondary | ICD-10-CM

## 2023-10-26 DIAGNOSIS — B372 Candidiasis of skin and nail: Secondary | ICD-10-CM | POA: Diagnosis not present

## 2023-10-26 DIAGNOSIS — K219 Gastro-esophageal reflux disease without esophagitis: Secondary | ICD-10-CM

## 2023-10-26 MED ORDER — KETOCONAZOLE 2 % EX CREA
1.0000 | TOPICAL_CREAM | Freq: Every day | CUTANEOUS | 2 refills | Status: DC
Start: 2023-10-26 — End: 2024-04-29

## 2023-10-26 MED ORDER — DICLOFENAC SODIUM 75 MG PO TBEC
75.0000 mg | DELAYED_RELEASE_TABLET | Freq: Two times a day (BID) | ORAL | 3 refills | Status: DC
Start: 2023-10-26 — End: 2023-11-22

## 2023-10-26 MED ORDER — FLUCONAZOLE 200 MG PO TABS: 200.0000 mg | ORAL_TABLET | Freq: Every day | ORAL | 0 refills | Status: AC

## 2023-10-26 MED ORDER — PANTOPRAZOLE SODIUM 40 MG PO TBEC
40.0000 mg | DELAYED_RELEASE_TABLET | Freq: Every day | ORAL | 1 refills | Status: DC
Start: 2023-10-26 — End: 2024-02-22

## 2023-10-26 NOTE — Progress Notes (Signed)
 [facility] [address] [place], [place] [address]  Internal MEDICINE  Office Visit Note  Patient Name: Taylor Navarro  [id number]  [id number]  Date of Service: 10/26/2023  Chief Complaint  Patient presents with   Annual Exam    HPI Taylor Navarro presents for an annual well visit and physical exam.  Well-appearing 63 y.o. female with prediabetes, GERD, low back pain, allergic rhinitis, osteoarthritis of the right knee,  Routine CRC screening: colonoscopy done in 2019, does not plan to have another one  Routine mammogram: declined any future mammogram DEXA scan: due now  Pap smear: has not had one in several years, has a level 2 prolapsed bladder.  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    Current Medication: Outpatient Encounter Medications as of 10/26/2023  Medication Sig   ciprofloxacin  (CIPRO ) 500 MG tablet Take 1 tablet (500 mg total) by mouth 2 (two) times daily.   diclofenac (VOLTAREN) 75 MG EC tablet Take 1 tablet (75 mg total) by mouth 2 (two) times daily.   fluconazole (DIFLUCAN) 200 MG tablet Take 1 tablet (200 mg total) by mouth daily for 21 days.   ketoconazole (NIZORAL) 2 % cream Apply 1 Application topically daily. To rash under breasts until resolved.   montelukast  (SINGULAIR ) 10 MG tablet Take 1 tablet (10 mg total) by mouth at bedtime.   posaconazole  (NOXAFIL ) 40 MG/ML suspension Take 10 ml by mouth twice daily x 3 days then continue taking 10 ml once daily for the next 25 days.   [DISCONTINUED] metroNIDAZOLE  (FLAGYL ) 500 MG tablet Take 1 tablet (500 mg total) by mouth 2 (two) times daily.   [DISCONTINUED] pantoprazole (PROTONIX) 40 MG tablet TAKE 1 TABLET BY MOUTH EVERY DAY   [DISCONTINUED] predniSONE  (STERAPRED UNI-PAK 21 TAB) 10 MG (21) TBPK tablet Use as directed for 6 days   [DISCONTINUED] rifaximin  (XIFAXAN ) 550 MG  TABS tablet Take 1 tablet (550 mg total) by mouth 3 (three) times daily.   dicyclomine  (BENTYL ) 10 MG capsule Take 1 capsule (10 mg total) by mouth 3 (three) times daily before meals for 10 days.   pantoprazole (PROTONIX) 40 MG tablet Take 1 tablet (40 mg total) by mouth daily.   No facility-administered encounter medications on file as of 10/26/2023.    Surgical History: Past Surgical History:  Procedure Laterality Date   KNEE SURGERY     ovarian  2004   scar tissue removal   TUBAL LIGATION  1999    Medical History: Past Medical History:  Diagnosis Date   Bacterial infection due to H. pylori    Gastritis     Family History: Family History  Problem Relation Age of Onset   Lung cancer Mother    Cancer Mother    Leukemia Father    Cancer Father     Social History   Socioeconomic History   Marital status: Married    Spouse name: Not on file   Number of children: Not on file   Years of education: Not on file   Highest education level: Not on file  Occupational History   Not on file  Tobacco Use   Smoking status: Never   Smokeless tobacco: Never  Substance and Sexual Activity   Alcohol use: Not Currently   Drug use: No   Sexual activity: Not Currently  Other Topics Concern   Not on  file  Social History Narrative   Not on file   Social Drivers of Health   Financial Resource Strain: Not on file  Food Insecurity: Not on file  Transportation Needs: Not on file  Physical Activity: Not on file  Stress: Not on file  Social Connections: Not on file  Intimate Partner Violence: Not on file      Review of Systems  Constitutional:  Negative for activity change, appetite change, chills, fatigue, fever and unexpected weight change.  HENT: Negative.  Negative for congestion, ear pain, rhinorrhea, sore throat and trouble swallowing.   Eyes: Negative.   Respiratory: Negative.  Negative for cough, chest tightness, shortness of breath and wheezing.   Cardiovascular:  Negative.  Negative for chest pain and palpitations.  Gastrointestinal: Negative.  Negative for abdominal pain, blood in stool, constipation, diarrhea, nausea and vomiting.  Endocrine: Negative.   Genitourinary: Negative.  Negative for difficulty urinating, dysuria, frequency, hematuria and urgency.  Musculoskeletal: Negative.  Negative for arthralgias, back pain, joint swelling, myalgias and neck pain.  Skin: Negative.  Negative for rash and wound.  Allergic/Immunologic: Negative.  Negative for immunocompromised state.  Neurological: Negative.  Negative for dizziness, seizures, numbness and headaches.  Hematological: Negative.   Psychiatric/Behavioral: Negative.  Negative for behavioral problems, self-injury and suicidal ideas. The patient is not nervous/anxious.     Vital Signs: BP 138/88   Pulse 76   Temp (!) 97 F (36.1 C)   Resp 16   Ht 5' (1.524 m)   Wt 157 lb 9.6 oz (71.5 kg)   SpO2 97%   BMI 30.78 kg/m    Physical Exam Vitals reviewed.  Constitutional:      General: She is awake. She is not in acute distress.    Appearance: Normal appearance. She is well-developed and well-groomed. She is not ill-appearing or diaphoretic.  HENT:     Head: Normocephalic and atraumatic.     Mouth/Throat:     Lips: Pink.     Pharynx: Uvula midline.  Eyes:     General: Lids are normal. Vision grossly intact. Gaze aligned appropriately.     Extraocular Movements: Extraocular movements intact.     Conjunctiva/sclera: Conjunctivae normal.     Pupils: Pupils are equal, round, and reactive to light.  Neck:     Thyroid: No thyromegaly.     Vascular: No JVD.     Trachea: Trachea and phonation normal. No tracheal deviation.  Cardiovascular:     Rate and Rhythm: Normal rate and regular rhythm.     Pulses: Normal pulses.     Heart sounds: Normal heart sounds, S1 normal and S2 normal. No murmur heard.    No friction rub. No gallop.  Pulmonary:     Effort: Pulmonary effort is normal. No  accessory muscle usage or respiratory distress.     Breath sounds: Normal breath sounds and air entry. No stridor. No wheezing or rales.  Chest:     Chest wall: No tenderness.  Skin:    General: Skin is warm and dry.     Capillary Refill: Capillary refill takes less than 2 seconds.  Neurological:     Mental Status: She is alert and oriented to person, place, and time.     Cranial Nerves: No cranial nerve deficit.     Motor: No abnormal muscle tone.     Coordination: Coordination normal.     Gait: Gait normal.     Deep Tendon Reflexes: Reflexes are normal and symmetric.  Psychiatric:  Mood and Affect: Mood and affect normal.        Behavior: Behavior normal. Behavior is cooperative.        Assessment/Plan: 1. Encounter for routine adult health examination with abnormal findings (Primary) Age-appropriate preventive screenings and vaccinations discussed. Routine labs for health maintenance deferred for now. PHM updated.    2. Oropharyngeal candidiasis 3 week course of fluconazole prescribed.  - fluconazole (DIFLUCAN) 200 MG tablet; Take 1 tablet (200 mg total) by mouth daily for 21 days.  Dispense: 21 tablet; Refill: 0  3. Yeast dermatitis Topical ketoconazole prescribed for yeast rash - ketoconazole (NIZORAL) 2 % cream; Apply 1 Application topically daily. To rash under breasts until resolved.  Dispense: 30 g; Refill: 2  4. Polyarthritis Start diclofenac as prescribed.  - diclofenac (VOLTAREN) 75 MG EC tablet; Take 1 tablet (75 mg total) by mouth 2 (two) times daily.  Dispense: 60 tablet; Refill: 3  5. Osteopenia, unspecified location Dexa scan ordered  - DG Bone Density; Future  6. Gastroesophageal reflux disease without esophagitis Continue pantoprazole as prescribed.  - pantoprazole (PROTONIX) 40 MG tablet; Take 1 tablet (40 mg total) by mouth daily.  Dispense: 90 tablet; Refill: 1  7. Ovarian failure due to menopause Dexa scan ordered  - DG Bone Density;  Future     General Counseling: Vung verbalizes understanding of the findings of todays visit and agrees with plan of treatment. I have discussed any further diagnostic evaluation that may be needed or ordered today. We also reviewed her medications today. she has been encouraged to call the office with any questions or concerns that should arise related to todays visit.    Orders Placed This Encounter  Procedures   DG Bone Density    Meds ordered this encounter  Medications   pantoprazole (PROTONIX) 40 MG tablet    Sig: Take 1 tablet (40 mg total) by mouth daily.    Dispense:  90 tablet    Refill:  1   fluconazole (DIFLUCAN) 200 MG tablet    Sig: Take 1 tablet (200 mg total) by mouth daily for 21 days.    Dispense:  21 tablet    Refill:  0    Fill new script today   ketoconazole (NIZORAL) 2 % cream    Sig: Apply 1 Application topically daily. To rash under breasts until resolved.    Dispense:  30 g    Refill:  2   diclofenac (VOLTAREN) 75 MG EC tablet    Sig: Take 1 tablet (75 mg total) by mouth 2 (two) times daily.    Dispense:  60 tablet    Refill:  3    Fill new script today.    Return in about 4 months (around 02/25/2024) for F/U, Allisha Harter PCP, eval new med, needs appt on a friday. .   Total time spent:30 Minutes Time spent includes review of chart, medications, test results, and follow up plan with the patient.   [place] Controlled Substance Database was reviewed by me.  This patient was seen by Laurence Pons, FNP-C in collaboration with Dr. Verneta Gone as a part of collaborative care agreement.  Alakai Macbride R. Bobbi Burow, MSN, FNP-C Internal medicine

## 2023-11-13 ENCOUNTER — Other Ambulatory Visit: Payer: Self-pay | Admitting: Nurse Practitioner

## 2023-11-13 DIAGNOSIS — B370 Candidal stomatitis: Secondary | ICD-10-CM

## 2023-11-22 ENCOUNTER — Telehealth: Payer: Self-pay

## 2023-11-22 MED ORDER — MELOXICAM 15 MG PO TABS: 15.0000 mg | ORAL_TABLET | Freq: Every day | ORAL | 5 refills | Status: DC

## 2023-11-23 NOTE — Telephone Encounter (Signed)
 Patient notified

## 2023-12-12 ENCOUNTER — Telehealth: Payer: Self-pay | Admitting: Nurse Practitioner

## 2023-12-12 NOTE — Telephone Encounter (Signed)
 Patient called stating I sent her dexa order to a non-participating facility and she will have to pay OOP for the scan. I explained to her, that her insurance is with [facility], and so is [employer] where I sent order. I s/w Alisha w/ [facility], she confirmed they are in network with patient's insurance. Insurance paying all on scan except $91.45, which is patient's responsibility (coins). Called patient back, no answer, lvm explaining-Toni

## 2024-02-22 ENCOUNTER — Ambulatory Visit: Admitting: Nurse Practitioner

## 2024-02-22 ENCOUNTER — Encounter: Payer: Self-pay | Admitting: Nurse Practitioner

## 2024-02-22 VITALS — BP 135/80 | HR 76 | Temp 97.6°F | Resp 16 | Ht 60.0 in | Wt 151.4 lb

## 2024-02-22 DIAGNOSIS — B370 Candidal stomatitis: Secondary | ICD-10-CM | POA: Diagnosis not present

## 2024-02-22 DIAGNOSIS — N8110 Cystocele, unspecified: Secondary | ICD-10-CM

## 2024-02-22 DIAGNOSIS — K219 Gastro-esophageal reflux disease without esophagitis: Secondary | ICD-10-CM

## 2024-02-22 DIAGNOSIS — E782 Mixed hyperlipidemia: Secondary | ICD-10-CM | POA: Diagnosis not present

## 2024-02-22 DIAGNOSIS — R252 Cramp and spasm: Secondary | ICD-10-CM | POA: Diagnosis not present

## 2024-02-22 DIAGNOSIS — R7303 Prediabetes: Secondary | ICD-10-CM | POA: Diagnosis not present

## 2024-02-22 DIAGNOSIS — G2581 Restless legs syndrome: Secondary | ICD-10-CM

## 2024-02-22 DIAGNOSIS — E538 Deficiency of other specified B group vitamins: Secondary | ICD-10-CM

## 2024-02-22 DIAGNOSIS — E559 Vitamin D deficiency, unspecified: Secondary | ICD-10-CM

## 2024-02-22 MED ORDER — ITRACONAZOLE 100 MG PO CAPS: 100.0000 mg | ORAL_CAPSULE | Freq: Every day | ORAL | 0 refills | Status: DC

## 2024-02-22 MED ORDER — PANTOPRAZOLE SODIUM 40 MG PO TBEC: 40.0000 mg | DELAYED_RELEASE_TABLET | Freq: Every day | ORAL | 1 refills | Status: AC

## 2024-02-22 NOTE — Progress Notes (Signed)
 [facility] [address] [place], [place] [address]  Internal MEDICINE  Office Visit Note  Patient Name: Taylor Navarro  [id number]  [id number]  Date of Service: 02/22/2024  Chief Complaint  Patient presents with   Follow-up    Yeast, hip pain, weight gain and leg cramps.     HPI Taylor Navarro presents for a follow-up visit for yeast of the esophagus, hip pain, restless legs, weight gain.  Esophageal yeast -- took fluconazoel for a month which did resolve the issue but once the medication course was complete, the problem came right back. So initially responded to fluconazole  but is refractory since it came back.  Right hip pain, and bilateral sciatica and leg muscle cramps at night bilaterally.  Restless legs syndrome Weight gain -- issues with weight gain and unable to lose weight. 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  Fatigue -- chronic and ongoing    Current Medication: Outpatient Encounter Medications as of 02/22/2024  Medication Sig   itraconazole  (SPORANOX ) 100 MG capsule Take 1 capsule (100 mg total) by mouth daily.   [DISCONTINUED] pantoprazole  (PROTONIX ) 40 MG tablet Take 1 tablet (40 mg total) by mouth daily.   dicyclomine  (BENTYL ) 10 MG capsule Take 1 capsule (10 mg total) by mouth 3 (three) times daily before meals for 10 days. (Patient not taking: Reported on 02/22/2024)   ketoconazole  (NIZORAL ) 2 % cream Apply 1 Application topically daily. To rash under breasts until resolved. (Patient not taking: Reported on 02/22/2024)   meloxicam  (MOBIC ) 15 MG tablet Take 1 tablet (15 mg total) by mouth daily. (Patient not taking: Reported on 02/22/2024)   montelukast  (SINGULAIR ) 10 MG tablet Take 1 tablet (10 mg total) by mouth at bedtime. (Patient not taking: Reported on 02/22/2024)   pantoprazole   (PROTONIX ) 40 MG tablet Take 1 tablet (40 mg total) by mouth daily.   No facility-administered encounter medications on file as of 02/22/2024.    Surgical History: Past Surgical History:  Procedure Laterality Date   KNEE SURGERY     ovarian  2004   scar tissue removal   TUBAL LIGATION  1999    Medical History: Past Medical History:  Diagnosis Date   Bacterial infection due to H. pylori    Gastritis     Family History: Family History  Problem Relation Age of Onset   Lung cancer Mother    Cancer Mother    Leukemia Father    Cancer Father     Social History   Socioeconomic History   Marital status: Married    Spouse name: Not on file   Number of children: Not on file   Years of education: Not on file   Highest education level: Not on file  Occupational History   Not on file  Tobacco Use   Smoking status: Never   Smokeless tobacco: Never  Substance and Sexual Activity   Alcohol use: Not Currently   Drug use: No   Sexual activity: Not Currently  Other Topics Concern   Not on file  Social History Narrative   Not on file   Social Drivers of Health   Financial Resource Strain: Not on file  Food Insecurity: Not on file  Transportation Needs: Not on file  Physical Activity: Not on file  Stress: Not on file  Social Connections: Not on file  Intimate Partner Violence: Not on file      Review of Systems  Constitutional:  Positive for activity change, appetite change, fatigue and unexpected weight change.  Respiratory: Negative.  Negative for cough, chest tightness, shortness of breath and wheezing.   Cardiovascular: Negative.  Negative for chest pain and palpitations.  Gastrointestinal: Negative.   Genitourinary: Negative.   Musculoskeletal: Negative.   Psychiatric/Behavioral:  Negative for self-injury, sleep disturbance and suicidal ideas. The patient is not nervous/anxious.     Vital Signs: BP 135/80   Pulse 76   Temp 97.6 F (36.4 C)   Resp 16    Ht 5' (1.524 m)   Wt 151 lb 6.4 oz (68.7 kg)   SpO2 96%   BMI 29.57 kg/m    Physical Exam Vitals reviewed.  Constitutional:      General: She is not in acute distress.    Appearance: Normal appearance. She is not ill-appearing.  HENT:     Head: Normocephalic and atraumatic.  Eyes:     Pupils: Pupils are equal, round, and reactive to light.  Cardiovascular:     Rate and Rhythm: Normal rate and regular rhythm.  Pulmonary:     Effort: Pulmonary effort is normal. No respiratory distress.  Musculoskeletal:        General: Normal range of motion.  Skin:    General: Skin is warm and dry.     Capillary Refill: Capillary refill takes less than 2 seconds.  Neurological:     Mental Status: She is alert and oriented to person, place, and time.  Psychiatric:        Mood and Affect: Mood normal.        Behavior: Behavior normal.        Assessment/Plan: 1. Oropharyngeal candidiasis (Primary) Itraconazole  prescribed.  - itraconazole  (SPORANOX ) 100 MG capsule; Take 1 capsule (100 mg total) by mouth daily.  Dispense: 21 capsule; Refill: 0  2. Bilateral leg cramps Routine labs ordered  - CBC with Differential/Platelet - CMP14+EGFR - Lipid Profile - Iron, TIBC and Ferritin Panel - B12 and Folate Panel - Hgb A1C w/o eAG - Vitamin D  (25 hydroxy) - Magnesium  3. Prediabetes Routine labs ordered  - CBC with Differential/Platelet - CMP14+EGFR - Lipid Profile - Hgb A1C w/o eAG  4. Mixed hyperlipidemia Routine labs ordered  - CBC with Differential/Platelet - CMP14+EGFR - Lipid Profile  5. Restless legs syndrome Routine labs ordered  - CBC with Differential/Platelet - Iron, TIBC and Ferritin Panel - B12 and Folate Panel  6. Bladder prolapse, female, acquired Noted, declined urology referral for now.   7. Gastroesophageal reflux disease without esophagitis Routine labs ordered  - pantoprazole  (PROTONIX ) 40 MG tablet; Take 1 tablet (40 mg total) by mouth daily.   Dispense: 90 tablet; Refill: 1  8. B12 deficiency Routine labs ordered  - CBC with Differential/Platelet - Iron, TIBC and Ferritin Panel - B12 and Folate Panel  9. Vitamin D  deficiency Routine labs ordered  - Vitamin D  (25 hydroxy)   General Counseling: Mirna verbalizes understanding of the findings of todays visit and agrees with plan of treatment. I have discussed any further diagnostic evaluation that may be needed or ordered today. We also reviewed her medications today. she has been encouraged to call the office with any questions or concerns that should arise related to todays visit.    Orders Placed This Encounter  Procedures   CBC with Differential/Platelet   CMP14+EGFR   Lipid  Profile   Iron, TIBC and Ferritin Panel   B12 and Folate Panel   Hgb A1C w/o eAG   Vitamin D  (25 hydroxy)   Magnesium    Meds ordered this encounter  Medications   itraconazole  (SPORANOX ) 100 MG capsule    Sig: Take 1 capsule (100 mg total) by mouth daily.    Dispense:  21 capsule    Refill:  0    Fill new script today, if PA required please send request   pantoprazole  (PROTONIX ) 40 MG tablet    Sig: Take 1 tablet (40 mg total) by mouth daily.    Dispense:  90 tablet    Refill:  1    For future refills, keep on file    Return in about 3 weeks (around 03/14/2024) for F/U, Labs, Nina Hoar PCP.   Total time spent:30 Minutes Time spent includes review of chart, medications, test results, and follow up plan with the patient.   [place] Controlled Substance Database was reviewed by me.  This patient was seen by Mardy Maxin, FNP-C in collaboration with Dr. Sigrid Bathe as a part of collaborative care agreement.   Kinda Pottle R. Maxin, MSN, FNP-C Internal medicine

## 2024-02-23 LAB — CBC WITH DIFFERENTIAL/PLATELET
Basophils Absolute: 0.1 x10E3/uL (ref 0.0–0.2)
Basos: 1 %
EOS (ABSOLUTE): 0.1 x10E3/uL (ref 0.0–0.4)
Eos: 1 %
Hematocrit: 46 % (ref 34.0–46.6)
Hemoglobin: 14.7 g/dL (ref 11.1–15.9)
Immature Grans (Abs): 0 x10E3/uL (ref 0.0–0.1)
Immature Granulocytes: 0 %
Lymphocytes Absolute: 3.2 x10E3/uL — ABNORMAL HIGH (ref 0.7–3.1)
Lymphs: 40 %
MCH: 28.3 pg (ref 26.6–33.0)
MCHC: 32 g/dL (ref 31.5–35.7)
MCV: 89 fL (ref 79–97)
Monocytes Absolute: 0.5 x10E3/uL (ref 0.1–0.9)
Monocytes: 6 %
Neutrophils Absolute: 4.1 x10E3/uL (ref 1.4–7.0)
Neutrophils: 52 %
Platelets: 315 x10E3/uL (ref 150–450)
RBC: 5.2 x10E6/uL (ref 3.77–5.28)
RDW: 13.1 % (ref 11.7–15.4)
WBC: 7.9 x10E3/uL (ref 3.4–10.8)

## 2024-02-23 LAB — CMP14+EGFR
ALT: 26 IU/L (ref 0–32)
AST: 36 IU/L (ref 0–40)
Albumin: 4.6 g/dL (ref 3.9–4.9)
Alkaline Phosphatase: 84 IU/L (ref 44–121)
BUN/Creatinine Ratio: 18 (ref 12–28)
BUN: 18 mg/dL (ref 8–27)
Bilirubin Total: 0.5 mg/dL (ref 0.0–1.2)
CO2: 25 mmol/L (ref 20–29)
Calcium: 9.7 mg/dL (ref 8.7–10.3)
Chloride: 102 mmol/L (ref 96–106)
Creatinine, Ser: 1.02 mg/dL — ABNORMAL HIGH (ref 0.57–1.00)
Globulin, Total: 2.4 g/dL (ref 1.5–4.5)
Glucose: 80 mg/dL (ref 70–99)
Potassium: 4.2 mmol/L (ref 3.5–5.2)
Sodium: 141 mmol/L (ref 134–144)
Total Protein: 7 g/dL (ref 6.0–8.5)
eGFR: 62 mL/min/1.73 (ref >59)

## 2024-02-23 LAB — IRON,TIBC AND FERRITIN PANEL
Ferritin: 70 ng/mL (ref 15–150)
Iron Saturation: 37 % (ref 15–55)
Iron: 136 ug/dL (ref 27–139)
Total Iron Binding Capacity: 366 ug/dL (ref 250–450)
UIBC: 230 ug/dL (ref 118–369)

## 2024-02-23 LAB — LIPID PANEL
Chol/HDL Ratio: 4 ratio (ref 0.0–4.4)
Cholesterol, Total: 220 mg/dL — ABNORMAL HIGH (ref 100–199)
HDL: 55 mg/dL (ref >39)
LDL Chol Calc (NIH): 135 mg/dL — ABNORMAL HIGH (ref 0–99)
Triglycerides: 169 mg/dL — ABNORMAL HIGH (ref 0–149)
VLDL Cholesterol Cal: 30 mg/dL (ref 5–40)

## 2024-02-23 LAB — HGB A1C W/O EAG: Hgb A1c MFr Bld: 5.8 % — ABNORMAL HIGH (ref 4.8–5.6)

## 2024-02-23 LAB — B12 AND FOLATE PANEL
Folate: 12.8 ng/mL (ref >3.0)
Vitamin B-12: 646 pg/mL (ref 232–1245)

## 2024-02-23 LAB — MAGNESIUM: Magnesium: 2.3 mg/dL (ref 1.6–2.3)

## 2024-02-23 LAB — VITAMIN D 25 HYDROXY (VIT D DEFICIENCY, FRACTURES): Vit D, 25-Hydroxy: 35.1 ng/mL (ref 30.0–100.0)

## 2024-02-27 ENCOUNTER — Telehealth: Payer: Self-pay

## 2024-02-27 NOTE — Telephone Encounter (Signed)
 Completed PA for patient's Itraconazole  100MG  capsules.

## 2024-02-27 NOTE — Telephone Encounter (Signed)
 Spoke with pt that we working on appeal mean while she can check with phar for good rx coupon

## 2024-02-27 NOTE — Telephone Encounter (Signed)
 Lmom to call us  back due to her med PA is denied if can use good rx

## 2024-03-21 ENCOUNTER — Encounter: Payer: Self-pay | Admitting: Nurse Practitioner

## 2024-03-21 ENCOUNTER — Ambulatory Visit: Admitting: Nurse Practitioner

## 2024-03-21 VITALS — BP 130/82 | HR 68 | Temp 97.0°F | Resp 16 | Ht 60.0 in | Wt 151.8 lb

## 2024-03-21 DIAGNOSIS — E782 Mixed hyperlipidemia: Secondary | ICD-10-CM | POA: Diagnosis not present

## 2024-03-21 DIAGNOSIS — K580 Irritable bowel syndrome with diarrhea: Secondary | ICD-10-CM | POA: Diagnosis not present

## 2024-03-21 DIAGNOSIS — B370 Candidal stomatitis: Secondary | ICD-10-CM

## 2024-03-21 DIAGNOSIS — R7303 Prediabetes: Secondary | ICD-10-CM

## 2024-03-21 MED ORDER — ITRACONAZOLE 100 MG PO CAPS: 100.0000 mg | ORAL_CAPSULE | Freq: Every day | ORAL | 0 refills | Status: AC

## 2024-03-21 NOTE — Progress Notes (Signed)
 [facility] [address] [place], [place] [address]  Internal MEDICINE  Office Visit Note  Patient Name: Taylor Navarro  [id number]  [id number]  Date of Service: 03/21/2024  Chief Complaint  Patient presents with   Follow-up    HPI [name] presents for a follow-up visit for prediabetes, IBS, high cholesterol, and  Prediabetes -- stable, A1c is 5.8 in August.  Candidiasis in the throat and esophagus- failed a course of fluconazole  High cholesterol -- elevated LDL, total cholesterol and triglycerides.  IBS with diarrhea.  -- still having issues, wants to be referred to GI    Current Medication: Outpatient Encounter Medications as of 03/21/2024  Medication Sig   itraconazole  (SPORANOX ) 100 MG capsule Take 1 capsule (100 mg total) by mouth daily.   pantoprazole  (PROTONIX ) 40 MG tablet Take 1 tablet (40 mg total) by mouth daily.   [DISCONTINUED] dicyclomine  (BENTYL ) 10 MG capsule Take 1 capsule (10 mg total) by mouth 3 (three) times daily before meals for 10 days. (Patient not taking: Reported on 02/22/2024)   [DISCONTINUED] itraconazole  (SPORANOX ) 100 MG capsule Take 1 capsule (100 mg total) by mouth daily.   [DISCONTINUED] ketoconazole  (NIZORAL ) 2 % cream Apply 1 Application topically daily. To rash under breasts until resolved. (Patient not taking: Reported on 02/22/2024)   [DISCONTINUED] meloxicam  (MOBIC ) 15 MG tablet Take 1 tablet (15 mg total) by mouth daily. (Patient not taking: Reported on 02/22/2024)   [DISCONTINUED] montelukast  (SINGULAIR ) 10 MG tablet Take 1 tablet (10 mg total) by mouth at bedtime. (Patient not taking: Reported on 02/22/2024)   No facility-administered encounter medications on file as of 03/21/2024.    Surgical History: Past Surgical History:  Procedure Laterality Date   KNEE SURGERY     ovarian  2004   scar tissue removal   TUBAL LIGATION  1999    Medical History: Past Medical History:  Diagnosis Date   Bacterial infection due to H.  pylori    Gastritis     Family History: Family History  Problem Relation Age of Onset   Lung cancer Mother    Cancer Mother    Leukemia Father    Cancer Father     Social History   Socioeconomic History   Marital status: Married    Spouse name: Not on file   Number of children: Not on file   Years of education: Not on file   Highest education level: Not on file  Occupational History   Not on file  Tobacco Use   Smoking status: Never   Smokeless tobacco: Never  Substance and Sexual Activity   Alcohol use: Not Currently   Drug use: No   Sexual activity: Not Currently  Other Topics Concern   Not on file  Social History Narrative   Not on file   Social Drivers of Health   Financial Resource Strain: Not on file  Food Insecurity: Not on file  Transportation Needs: Not on file  Physical Activity: Not on file  Stress: Not on file  Social Connections: Not on file  Intimate Partner Violence: Not on file      Review of Systems  Constitutional:  Positive for activity change, appetite change, fatigue and unexpected weight change.  HENT:  Positive for sore throat.   Respiratory: Negative.  Negative for cough, chest tightness, shortness of breath and wheezing.   Cardiovascular: Negative.  Negative for chest pain and palpitations.  Gastrointestinal: Negative.   Genitourinary: Negative.   Musculoskeletal: Negative.   Psychiatric/Behavioral:  Negative for self-injury, sleep disturbance and suicidal ideas. The patient is not nervous/anxious.     Vital Signs: BP 130/82   Pulse 68   Temp (!) 97 F (36.1 C)   Resp 16   Ht 5' (1.524 m)   Wt 151 lb 12.8 oz (68.9 kg)   SpO2 98%   BMI 29.65 kg/m    Physical Exam Vitals reviewed.  Constitutional:      General: She is not in acute distress.    Appearance: Normal appearance. She is not ill-appearing.  HENT:     Head: Normocephalic and atraumatic.     Mouth/Throat:     Pharynx: Oropharyngeal exudate present.  Eyes:      Pupils: Pupils are equal, round, and reactive to light.  Cardiovascular:     Rate and Rhythm: Normal rate and regular rhythm.  Pulmonary:     Effort: Pulmonary effort is normal. No respiratory distress.  Musculoskeletal:        General: Normal range of motion.  Skin:    General: Skin is warm and dry.     Capillary Refill: Capillary refill takes less than 2 seconds.  Neurological:     Mental Status: She is alert and oriented to person, place, and time.  Psychiatric:        Mood and Affect: Mood normal.        Behavior: Behavior normal.        Assessment/Plan: 1. Oropharyngeal candidiasis Itraconazole  prescribed. Patient will use  - itraconazole  (SPORANOX ) 100 MG capsule; Take 1 capsule (100 mg total) by mouth daily.  Dispense: 21 capsule; Refill: 0  2. Irritable bowel syndrome with diarrhea (Primary) Referred to GI - Ambulatory referral to Gastroenterology  3. Prediabetes Continue low carb diet.   4. Mixed hyperlipidemia Continue low cholesterol diet. Not currently on statin therapy. Declined medication for now.    General Counseling: [name] verbalizes understanding of the findings of todays visit and agrees with plan of treatment. I have discussed any further diagnostic evaluation that may be needed or ordered today. We also reviewed her medications today. she has been encouraged to call the office with any questions or concerns that should arise related to todays visit.    Orders Placed This Encounter  Procedures   Ambulatory referral to Gastroenterology    Meds ordered this encounter  Medications   itraconazole  (SPORANOX ) 100 MG capsule    Sig: Take 1 capsule (100 mg total) by mouth daily.    Dispense:  21 capsule    Refill:  0    Fill new script today, DO NOT run through insurance , patient will bring goodrx coupon to pharmacy.    Return in about 6 months (around 09/18/2024).   Total time spent:30 Minutes Time spent includes review of chart,  medications, test results, and follow up plan with the patient.   [place] Controlled Substance Database was reviewed by me.  This patient was seen by Mardy Maxin, FNP-C in collaboration with Dr. Sigrid Bathe as a part of collaborative care agreement.   Darline Faith R. Maxin, MSN, FNP-C Internal medicine

## 2024-03-24 ENCOUNTER — Telehealth: Payer: Self-pay | Admitting: Nurse Practitioner

## 2024-03-24 NOTE — Telephone Encounter (Signed)
 Awaiting 03/21/24 office notes for Gastroenterology referral-Toni

## 2024-04-09 ENCOUNTER — Encounter: Payer: Self-pay | Admitting: Nurse Practitioner

## 2024-04-29 ENCOUNTER — Telehealth: Payer: Self-pay | Admitting: Nurse Practitioner

## 2024-04-29 ENCOUNTER — Encounter: Payer: Self-pay | Admitting: Nurse Practitioner

## 2024-04-29 DIAGNOSIS — K580 Irritable bowel syndrome with diarrhea: Secondary | ICD-10-CM | POA: Insufficient documentation

## 2024-04-29 NOTE — Telephone Encounter (Signed)
 Gastroenterology referral faxed to [facility]; [phone] . Notified patient. Gave pt telephone [phone]

## 2024-09-19 ENCOUNTER — Ambulatory Visit: Admitting: Nurse Practitioner

## 2024-10-28 ENCOUNTER — Encounter: Admitting: Nurse Practitioner
